# Patient Record
Sex: Female | Born: 1940 | Race: Black or African American | Hispanic: No | Marital: Married | Smoking: Never smoker
Health system: Southern US, Community
[De-identification: ages and names within clinical notes are randomized; demographics above are authoritative.]

## PROBLEM LIST (undated history)

## (undated) DIAGNOSIS — K8051 Calculus of bile duct without cholangitis or cholecystitis with obstruction: Secondary | ICD-10-CM

## (undated) DIAGNOSIS — M1990 Unspecified osteoarthritis, unspecified site: Secondary | ICD-10-CM

## (undated) DIAGNOSIS — H547 Unspecified visual loss: Secondary | ICD-10-CM

## (undated) DIAGNOSIS — K219 Gastro-esophageal reflux disease without esophagitis: Secondary | ICD-10-CM

## (undated) DIAGNOSIS — E785 Hyperlipidemia, unspecified: Secondary | ICD-10-CM

## (undated) DIAGNOSIS — K589 Irritable bowel syndrome without diarrhea: Secondary | ICD-10-CM

## (undated) DIAGNOSIS — B009 Herpesviral infection, unspecified: Secondary | ICD-10-CM

## (undated) DIAGNOSIS — I341 Nonrheumatic mitral (valve) prolapse: Secondary | ICD-10-CM

## (undated) DIAGNOSIS — C50919 Malignant neoplasm of unspecified site of unspecified female breast: Secondary | ICD-10-CM

## (undated) HISTORY — PX: TOTAL ABDOMINAL HYSTERECTOMY W/ BILATERAL SALPINGOOPHORECTOMY: SHX83

## (undated) HISTORY — DX: Nonrheumatic mitral (valve) prolapse: I34.1

## (undated) HISTORY — DX: Herpesviral infection, unspecified: B00.9

## (undated) HISTORY — PX: TONSILLECTOMY: SUR1361

## (undated) HISTORY — PX: VARICOSE VEIN SURGERY: SHX832

## (undated) HISTORY — DX: Gastro-esophageal reflux disease without esophagitis: K21.9

## (undated) HISTORY — DX: Unspecified osteoarthritis, unspecified site: M19.90

## (undated) HISTORY — DX: Irritable bowel syndrome without diarrhea: K58.9

## (undated) HISTORY — DX: Hyperlipidemia, unspecified: E78.5

## (undated) HISTORY — DX: Unspecified visual loss: H54.7

## (undated) HISTORY — DX: Malignant neoplasm of unspecified site of unspecified female breast: C50.919

---

## 1991-09-03 HISTORY — PX: EYE SURGERY: SHX253

## 1997-09-02 DIAGNOSIS — B009 Herpesviral infection, unspecified: Secondary | ICD-10-CM

## 1997-09-02 HISTORY — DX: Herpesviral infection, unspecified: B00.9

## 1997-12-22 ENCOUNTER — Other Ambulatory Visit: Admission: RE | Admit: 1997-12-22 | Discharge: 1997-12-22 | Payer: Self-pay | Admitting: *Deleted

## 1998-03-29 ENCOUNTER — Other Ambulatory Visit: Admission: RE | Admit: 1998-03-29 | Discharge: 1998-03-29 | Payer: Self-pay | Admitting: *Deleted

## 1998-08-04 ENCOUNTER — Ambulatory Visit (HOSPITAL_COMMUNITY): Admission: RE | Admit: 1998-08-04 | Discharge: 1998-08-04 | Payer: Self-pay | Admitting: *Deleted

## 1999-05-03 ENCOUNTER — Other Ambulatory Visit: Admission: RE | Admit: 1999-05-03 | Discharge: 1999-05-03 | Payer: Self-pay | Admitting: *Deleted

## 1999-08-06 ENCOUNTER — Ambulatory Visit (HOSPITAL_COMMUNITY): Admission: RE | Admit: 1999-08-06 | Discharge: 1999-08-06 | Payer: Self-pay | Admitting: *Deleted

## 2000-05-07 ENCOUNTER — Encounter (INDEPENDENT_AMBULATORY_CARE_PROVIDER_SITE_OTHER): Payer: Self-pay | Admitting: Specialist

## 2000-05-07 ENCOUNTER — Ambulatory Visit (HOSPITAL_COMMUNITY): Admission: EM | Admit: 2000-05-07 | Discharge: 2000-05-07 | Payer: Self-pay | Admitting: Emergency Medicine

## 2000-05-13 ENCOUNTER — Other Ambulatory Visit: Admission: RE | Admit: 2000-05-13 | Discharge: 2000-05-13 | Payer: Self-pay | Admitting: *Deleted

## 2000-05-13 ENCOUNTER — Ambulatory Visit (HOSPITAL_COMMUNITY): Admission: RE | Admit: 2000-05-13 | Discharge: 2000-05-13 | Payer: Self-pay | Admitting: *Deleted

## 2000-05-15 ENCOUNTER — Encounter: Admission: RE | Admit: 2000-05-15 | Discharge: 2000-05-15 | Payer: Self-pay | Admitting: *Deleted

## 2000-06-20 ENCOUNTER — Ambulatory Visit (HOSPITAL_COMMUNITY): Admission: RE | Admit: 2000-06-20 | Discharge: 2000-06-20 | Payer: Self-pay | Admitting: Gastroenterology

## 2000-11-01 ENCOUNTER — Emergency Department (HOSPITAL_COMMUNITY): Admission: EM | Admit: 2000-11-01 | Discharge: 2000-11-01 | Payer: Self-pay | Admitting: Emergency Medicine

## 2001-05-18 ENCOUNTER — Encounter: Admission: RE | Admit: 2001-05-18 | Discharge: 2001-05-18 | Payer: Self-pay | Admitting: Internal Medicine

## 2001-06-09 ENCOUNTER — Ambulatory Visit (HOSPITAL_COMMUNITY): Admission: RE | Admit: 2001-06-09 | Discharge: 2001-06-09 | Payer: Self-pay | Admitting: Internal Medicine

## 2002-05-19 ENCOUNTER — Encounter: Payer: Self-pay | Admitting: Internal Medicine

## 2002-05-19 ENCOUNTER — Encounter: Admission: RE | Admit: 2002-05-19 | Discharge: 2002-05-19 | Payer: Self-pay | Admitting: Internal Medicine

## 2002-06-15 ENCOUNTER — Other Ambulatory Visit: Admission: RE | Admit: 2002-06-15 | Discharge: 2002-06-15 | Payer: Self-pay | Admitting: Internal Medicine

## 2002-12-15 ENCOUNTER — Encounter: Payer: Self-pay | Admitting: Internal Medicine

## 2002-12-15 ENCOUNTER — Ambulatory Visit (HOSPITAL_COMMUNITY): Admission: RE | Admit: 2002-12-15 | Discharge: 2002-12-15 | Payer: Self-pay | Admitting: Internal Medicine

## 2003-05-24 ENCOUNTER — Encounter: Admission: RE | Admit: 2003-05-24 | Discharge: 2003-05-24 | Payer: Self-pay | Admitting: Internal Medicine

## 2003-05-24 ENCOUNTER — Encounter: Payer: Self-pay | Admitting: Internal Medicine

## 2003-06-21 ENCOUNTER — Other Ambulatory Visit: Admission: RE | Admit: 2003-06-21 | Discharge: 2003-06-21 | Payer: Self-pay | Admitting: Internal Medicine

## 2003-10-11 ENCOUNTER — Ambulatory Visit (HOSPITAL_COMMUNITY): Admission: RE | Admit: 2003-10-11 | Discharge: 2003-10-11 | Payer: Self-pay | Admitting: Gastroenterology

## 2004-05-24 ENCOUNTER — Ambulatory Visit (HOSPITAL_COMMUNITY): Admission: RE | Admit: 2004-05-24 | Discharge: 2004-05-24 | Payer: Self-pay | Admitting: Internal Medicine

## 2004-06-27 ENCOUNTER — Other Ambulatory Visit: Admission: RE | Admit: 2004-06-27 | Discharge: 2004-06-27 | Payer: Self-pay | Admitting: Hematology and Oncology

## 2004-07-11 ENCOUNTER — Ambulatory Visit: Payer: Self-pay | Admitting: Hematology and Oncology

## 2005-06-04 ENCOUNTER — Ambulatory Visit (HOSPITAL_COMMUNITY): Admission: RE | Admit: 2005-06-04 | Discharge: 2005-06-04 | Payer: Self-pay | Admitting: Internal Medicine

## 2006-06-09 ENCOUNTER — Ambulatory Visit (HOSPITAL_COMMUNITY): Admission: RE | Admit: 2006-06-09 | Discharge: 2006-06-09 | Payer: Self-pay | Admitting: Internal Medicine

## 2006-10-08 ENCOUNTER — Ambulatory Visit: Payer: Self-pay

## 2007-03-19 ENCOUNTER — Other Ambulatory Visit: Admission: RE | Admit: 2007-03-19 | Discharge: 2007-03-19 | Payer: Self-pay | Admitting: Radiology

## 2007-03-19 DIAGNOSIS — C50919 Malignant neoplasm of unspecified site of unspecified female breast: Secondary | ICD-10-CM | POA: Insufficient documentation

## 2007-03-19 HISTORY — DX: Malignant neoplasm of unspecified site of unspecified female breast: C50.919

## 2007-03-31 ENCOUNTER — Encounter: Admission: RE | Admit: 2007-03-31 | Discharge: 2007-03-31 | Payer: Self-pay | Admitting: Radiology

## 2007-04-02 ENCOUNTER — Encounter: Admission: RE | Admit: 2007-04-02 | Discharge: 2007-04-02 | Payer: Self-pay | Admitting: Surgery

## 2007-04-06 ENCOUNTER — Ambulatory Visit (HOSPITAL_BASED_OUTPATIENT_CLINIC_OR_DEPARTMENT_OTHER): Admission: RE | Admit: 2007-04-06 | Discharge: 2007-04-06 | Payer: Self-pay | Admitting: Surgery

## 2007-04-06 ENCOUNTER — Encounter (INDEPENDENT_AMBULATORY_CARE_PROVIDER_SITE_OTHER): Payer: Self-pay | Admitting: Surgery

## 2007-04-06 HISTORY — PX: BREAST LUMPECTOMY: SHX2

## 2007-04-21 ENCOUNTER — Ambulatory Visit: Admission: RE | Admit: 2007-04-21 | Discharge: 2007-05-26 | Payer: Self-pay | Admitting: Radiation Oncology

## 2007-04-23 ENCOUNTER — Ambulatory Visit: Payer: Self-pay | Admitting: Hematology and Oncology

## 2007-04-23 LAB — CBC WITH DIFFERENTIAL/PLATELET
Basophils Absolute: 0 10*3/uL (ref 0.0–0.1)
Eosinophils Absolute: 0.1 10*3/uL (ref 0.0–0.5)
HGB: 13.4 g/dL (ref 11.6–15.9)
MCV: 87.4 fL (ref 81.0–101.0)
MONO%: 12.9 % (ref 0.0–13.0)
NEUT#: 1.4 10*3/uL — ABNORMAL LOW (ref 1.5–6.5)
Platelets: 262 10*3/uL (ref 145–400)
RDW: 14.3 % (ref 11.3–14.5)

## 2007-04-23 LAB — COMPREHENSIVE METABOLIC PANEL
Albumin: 3.8 g/dL (ref 3.5–5.2)
Alkaline Phosphatase: 68 U/L (ref 39–117)
BUN: 10 mg/dL (ref 6–23)
Calcium: 9.1 mg/dL (ref 8.4–10.5)
Glucose, Bld: 85 mg/dL (ref 70–99)
Potassium: 4.4 mEq/L (ref 3.5–5.3)

## 2007-04-23 LAB — LACTATE DEHYDROGENASE: LDH: 145 U/L (ref 94–250)

## 2007-04-28 ENCOUNTER — Ambulatory Visit: Admission: RE | Admit: 2007-04-28 | Discharge: 2007-04-28 | Payer: Self-pay | Admitting: Hematology and Oncology

## 2007-04-30 ENCOUNTER — Ambulatory Visit (HOSPITAL_COMMUNITY): Admission: RE | Admit: 2007-04-30 | Discharge: 2007-04-30 | Payer: Self-pay | Admitting: Hematology and Oncology

## 2007-04-30 ENCOUNTER — Encounter: Payer: Self-pay | Admitting: Hematology and Oncology

## 2007-05-05 ENCOUNTER — Ambulatory Visit (HOSPITAL_COMMUNITY): Admission: RE | Admit: 2007-05-05 | Discharge: 2007-05-05 | Payer: Self-pay | Admitting: Hematology and Oncology

## 2007-05-06 ENCOUNTER — Ambulatory Visit (HOSPITAL_BASED_OUTPATIENT_CLINIC_OR_DEPARTMENT_OTHER): Admission: RE | Admit: 2007-05-06 | Discharge: 2007-05-07 | Payer: Self-pay | Admitting: Surgery

## 2007-05-06 ENCOUNTER — Encounter (INDEPENDENT_AMBULATORY_CARE_PROVIDER_SITE_OTHER): Payer: Self-pay | Admitting: Surgery

## 2007-05-06 HISTORY — PX: AXILLARY NODE DISSECTION: SHX1211

## 2007-05-14 LAB — CBC WITH DIFFERENTIAL/PLATELET
EOS%: 3.2 % (ref 0.0–7.0)
Eosinophils Absolute: 0.1 10*3/uL (ref 0.0–0.5)
LYMPH%: 40.2 % (ref 14.0–48.0)
MCH: 30.2 pg (ref 26.0–34.0)
MCV: 86.9 fL (ref 81.0–101.0)
MONO%: 10.3 % (ref 0.0–13.0)
NEUT#: 1.8 10*3/uL (ref 1.5–6.5)
Platelets: 244 10*3/uL (ref 145–400)
RBC: 4.21 10*6/uL (ref 3.70–5.32)

## 2007-05-14 LAB — COMPREHENSIVE METABOLIC PANEL
ALT: 9 U/L (ref 0–35)
CO2: 25 mEq/L (ref 19–32)
Creatinine, Ser: 0.66 mg/dL (ref 0.40–1.20)
Glucose, Bld: 97 mg/dL (ref 70–99)
Total Bilirubin: 0.4 mg/dL (ref 0.3–1.2)

## 2007-05-14 LAB — UA PROTEIN, DIPSTICK - CHCC: Protein, Urine: NEGATIVE mg/dL

## 2007-05-20 ENCOUNTER — Ambulatory Visit (HOSPITAL_COMMUNITY): Admission: RE | Admit: 2007-05-20 | Discharge: 2007-05-20 | Payer: Self-pay | Admitting: Hematology and Oncology

## 2007-05-25 ENCOUNTER — Encounter: Payer: Self-pay | Admitting: Hematology and Oncology

## 2007-05-25 ENCOUNTER — Other Ambulatory Visit: Admission: RE | Admit: 2007-05-25 | Discharge: 2007-05-25 | Payer: Self-pay | Admitting: Hematology and Oncology

## 2007-05-28 ENCOUNTER — Encounter: Admission: RE | Admit: 2007-05-28 | Discharge: 2007-08-26 | Payer: Self-pay | Admitting: Surgery

## 2007-06-03 LAB — FLOW CYTOMETRY

## 2007-06-09 ENCOUNTER — Ambulatory Visit: Payer: Self-pay | Admitting: Hematology and Oncology

## 2007-06-10 LAB — CBC WITH DIFFERENTIAL/PLATELET
BASO%: 1.3 % (ref 0.0–2.0)
Eosinophils Absolute: 0 10*3/uL (ref 0.0–0.5)
MCHC: 35 g/dL (ref 32.0–36.0)
MONO#: 0.2 10*3/uL (ref 0.1–0.9)
NEUT#: 0.5 10*3/uL — ABNORMAL LOW (ref 1.5–6.5)
RBC: 4.59 10*6/uL (ref 3.70–5.32)
RDW: 13.9 % (ref 11.3–14.5)
WBC: 1.5 10*3/uL — ABNORMAL LOW (ref 3.9–10.0)
lymph#: 0.8 10*3/uL — ABNORMAL LOW (ref 0.9–3.3)

## 2007-06-23 LAB — CBC WITH DIFFERENTIAL/PLATELET
BASO%: 1.2 % (ref 0.0–2.0)
Eosinophils Absolute: 0.1 10*3/uL (ref 0.0–0.5)
HCT: 36.4 % (ref 34.8–46.6)
LYMPH%: 40.1 % (ref 14.0–48.0)
MONO#: 0.5 10*3/uL (ref 0.1–0.9)
NEUT#: 1.4 10*3/uL — ABNORMAL LOW (ref 1.5–6.5)
NEUT%: 41.1 % (ref 39.6–76.8)
Platelets: 255 10*3/uL (ref 145–400)
WBC: 3.3 10*3/uL — ABNORMAL LOW (ref 3.9–10.0)
lymph#: 1.3 10*3/uL (ref 0.9–3.3)

## 2007-06-23 LAB — RESEARCH LABS

## 2007-06-23 LAB — COMPREHENSIVE METABOLIC PANEL
ALT: 29 U/L (ref 0–35)
CO2: 29 mEq/L (ref 19–32)
Calcium: 8.8 mg/dL (ref 8.4–10.5)
Chloride: 104 mEq/L (ref 96–112)
Glucose, Bld: 118 mg/dL — ABNORMAL HIGH (ref 70–99)
Sodium: 142 mEq/L (ref 135–145)
Total Bilirubin: 0.6 mg/dL (ref 0.3–1.2)
Total Protein: 5.9 g/dL — ABNORMAL LOW (ref 6.0–8.3)

## 2007-06-23 LAB — UA PROTEIN, DIPSTICK - CHCC: Protein, Urine: <30 mg/dL

## 2007-07-15 LAB — CBC WITH DIFFERENTIAL/PLATELET
BASO%: 0.8 % (ref 0.0–2.0)
Basophils Absolute: 0 10*3/uL (ref 0.0–0.1)
EOS%: 1.5 % (ref 0.0–7.0)
HGB: 12.1 g/dL (ref 11.6–15.9)
MCH: 30 pg (ref 26.0–34.0)
MCHC: 34.4 g/dL (ref 32.0–36.0)
MCV: 87.3 fL (ref 81.0–101.0)
MONO%: 8.4 % (ref 0.0–13.0)
RBC: 4.05 10*6/uL (ref 3.70–5.32)
RDW: 16.2 % — ABNORMAL HIGH (ref 11.3–14.5)
lymph#: 1.2 10*3/uL (ref 0.9–3.3)

## 2007-07-15 LAB — COMPREHENSIVE METABOLIC PANEL
ALT: 31 U/L (ref 0–35)
Albumin: 3.2 g/dL — ABNORMAL LOW (ref 3.5–5.2)
Alkaline Phosphatase: 70 U/L (ref 39–117)
CO2: 28 mEq/L (ref 19–32)
Glucose, Bld: 130 mg/dL — ABNORMAL HIGH (ref 70–99)
Potassium: 4.2 mEq/L (ref 3.5–5.3)
Sodium: 142 mEq/L (ref 135–145)
Total Bilirubin: 0.5 mg/dL (ref 0.3–1.2)
Total Protein: 6.1 g/dL (ref 6.0–8.3)

## 2007-07-15 LAB — UA PROTEIN, DIPSTICK - CHCC: Protein, Urine: NEGATIVE mg/dL

## 2007-08-03 ENCOUNTER — Ambulatory Visit: Payer: Self-pay | Admitting: Hematology and Oncology

## 2007-08-03 ENCOUNTER — Ambulatory Visit: Payer: Self-pay

## 2007-08-03 ENCOUNTER — Encounter: Payer: Self-pay | Admitting: Hematology and Oncology

## 2007-08-05 LAB — CBC WITH DIFFERENTIAL/PLATELET
BASO%: 0.4 % (ref 0.0–2.0)
EOS%: 0.4 % (ref 0.0–7.0)
Eosinophils Absolute: 0 10*3/uL (ref 0.0–0.5)
LYMPH%: 32.9 % (ref 14.0–48.0)
MCHC: 35.1 g/dL (ref 32.0–36.0)
MCV: 88.7 fL (ref 81.0–101.0)
MONO%: 8.5 % (ref 0.0–13.0)
NEUT#: 2.3 10*3/uL (ref 1.5–6.5)
Platelets: 162 10*3/uL (ref 145–400)
RBC: 3.89 10*6/uL (ref 3.70–5.32)
RDW: 19 % — ABNORMAL HIGH (ref 11.3–14.5)

## 2007-08-05 LAB — COMPREHENSIVE METABOLIC PANEL
ALT: 29 U/L (ref 0–35)
AST: 28 U/L (ref 0–37)
Albumin: 3.2 g/dL — ABNORMAL LOW (ref 3.5–5.2)
Alkaline Phosphatase: 76 U/L (ref 39–117)
BUN: 5 mg/dL — ABNORMAL LOW (ref 6–23)
CO2: 29 mEq/L (ref 19–32)
Calcium: 9.5 mg/dL (ref 8.4–10.5)
Chloride: 107 mEq/L (ref 96–112)
Creatinine, Ser: 0.74 mg/dL (ref 0.40–1.20)
Glucose, Bld: 112 mg/dL — ABNORMAL HIGH (ref 70–99)
Potassium: 3.6 mEq/L (ref 3.5–5.3)
Sodium: 142 mEq/L (ref 135–145)
Total Bilirubin: 0.4 mg/dL (ref 0.3–1.2)
Total Protein: 6.3 g/dL (ref 6.0–8.3)

## 2007-08-05 LAB — UA PROTEIN, DIPSTICK - CHCC: Protein, Urine: NEGATIVE mg/dL

## 2007-08-05 LAB — RESEARCH LABS

## 2007-08-25 LAB — COMPREHENSIVE METABOLIC PANEL
ALT: 31 U/L (ref 0–35)
AST: 31 U/L (ref 0–37)
Albumin: 3.3 g/dL — ABNORMAL LOW (ref 3.5–5.2)
Calcium: 8.8 mg/dL (ref 8.4–10.5)
Chloride: 105 mEq/L (ref 96–112)
Potassium: 3.1 mEq/L — ABNORMAL LOW (ref 3.5–5.3)
Sodium: 142 mEq/L (ref 135–145)
Total Protein: 6.1 g/dL (ref 6.0–8.3)

## 2007-08-25 LAB — CBC WITH DIFFERENTIAL/PLATELET
BASO%: 1 % (ref 0.0–2.0)
MCHC: 34.9 g/dL (ref 32.0–36.0)
MONO#: 0.4 10*3/uL (ref 0.1–0.9)
RBC: 3.58 10*6/uL — ABNORMAL LOW (ref 3.70–5.32)
WBC: 3.7 10*3/uL — ABNORMAL LOW (ref 3.9–10.0)
lymph#: 1.5 10*3/uL (ref 0.9–3.3)

## 2007-08-25 LAB — UA PROTEIN, DIPSTICK - CHCC: Protein, Urine: NEGATIVE mg/dL

## 2007-09-16 LAB — COMPREHENSIVE METABOLIC PANEL
AST: 31 U/L (ref 0–37)
Alkaline Phosphatase: 62 U/L (ref 39–117)
BUN: 5 mg/dL — ABNORMAL LOW (ref 6–23)
Creatinine, Ser: 0.72 mg/dL (ref 0.40–1.20)
Glucose, Bld: 106 mg/dL — ABNORMAL HIGH (ref 70–99)
Potassium: 3.4 mEq/L — ABNORMAL LOW (ref 3.5–5.3)
Total Bilirubin: 0.6 mg/dL (ref 0.3–1.2)

## 2007-09-16 LAB — CBC WITH DIFFERENTIAL/PLATELET
BASO%: 0 % (ref 0.0–2.0)
EOS%: 0.1 % (ref 0.0–7.0)
HCT: 31.5 % — ABNORMAL LOW (ref 34.8–46.6)
MCH: 32.5 pg (ref 26.0–34.0)
MCHC: 34.1 g/dL (ref 32.0–36.0)
NEUT%: 74.7 % (ref 39.6–76.8)
lymph#: 0.8 10*3/uL — ABNORMAL LOW (ref 0.9–3.3)

## 2007-09-18 ENCOUNTER — Ambulatory Visit: Payer: Self-pay | Admitting: Hematology and Oncology

## 2007-10-02 ENCOUNTER — Encounter: Payer: Self-pay | Admitting: Hematology and Oncology

## 2007-10-02 ENCOUNTER — Ambulatory Visit: Payer: Self-pay

## 2007-10-06 LAB — COMPREHENSIVE METABOLIC PANEL
AST: 30 U/L (ref 0–37)
Alkaline Phosphatase: 69 U/L (ref 39–117)
BUN: 3 mg/dL — ABNORMAL LOW (ref 6–23)
Calcium: 7.9 mg/dL — ABNORMAL LOW (ref 8.4–10.5)
Creatinine, Ser: 0.77 mg/dL (ref 0.40–1.20)
Total Bilirubin: 0.4 mg/dL (ref 0.3–1.2)

## 2007-10-06 LAB — CBC WITH DIFFERENTIAL/PLATELET
Eosinophils Absolute: 0 10*3/uL (ref 0.0–0.5)
MONO#: 0.4 10*3/uL (ref 0.1–0.9)
MONO%: 11.9 % (ref 0.0–13.0)
NEUT#: 1.4 10*3/uL — ABNORMAL LOW (ref 1.5–6.5)
RBC: 3.26 10*6/uL — ABNORMAL LOW (ref 3.70–5.32)
RDW: 19.7 % — ABNORMAL HIGH (ref 11.3–14.5)
WBC: 3.2 10*3/uL — ABNORMAL LOW (ref 3.9–10.0)
lymph#: 1.4 10*3/uL (ref 0.9–3.3)

## 2007-10-06 LAB — UA PROTEIN, DIPSTICK - CHCC: Protein, Urine: <30 mg/dL

## 2007-10-12 ENCOUNTER — Ambulatory Visit: Admission: RE | Admit: 2007-10-12 | Discharge: 2007-12-17 | Payer: Self-pay | Admitting: Radiation Oncology

## 2007-10-23 ENCOUNTER — Ambulatory Visit: Payer: Self-pay | Admitting: Hematology and Oncology

## 2007-10-28 ENCOUNTER — Encounter: Admission: RE | Admit: 2007-10-28 | Discharge: 2007-12-22 | Payer: Self-pay | Admitting: Radiation Oncology

## 2007-11-17 LAB — COMPREHENSIVE METABOLIC PANEL
Albumin: 4 g/dL (ref 3.5–5.2)
CO2: 27 mEq/L (ref 19–32)
Calcium: 9.2 mg/dL (ref 8.4–10.5)
Glucose, Bld: 100 mg/dL — ABNORMAL HIGH (ref 70–99)
Potassium: 3.7 mEq/L (ref 3.5–5.3)
Sodium: 141 mEq/L (ref 135–145)
Total Protein: 7.1 g/dL (ref 6.0–8.3)

## 2007-11-17 LAB — CBC WITH DIFFERENTIAL/PLATELET
Basophils Absolute: 0 10*3/uL (ref 0.0–0.1)
Eosinophils Absolute: 0.1 10*3/uL (ref 0.0–0.5)
HCT: 38.9 % (ref 34.8–46.6)
HGB: 13.5 g/dL (ref 11.6–15.9)
MCV: 97.2 fL (ref 81.0–101.0)
NEUT#: 1.5 10*3/uL (ref 1.5–6.5)
RDW: 13.6 % (ref 11.3–14.5)
lymph#: 1.4 10*3/uL (ref 0.9–3.3)

## 2007-12-08 ENCOUNTER — Ambulatory Visit: Payer: Self-pay | Admitting: Hematology and Oncology

## 2007-12-30 LAB — COMPREHENSIVE METABOLIC PANEL
ALT: 14 U/L (ref 0–35)
AST: 21 U/L (ref 0–37)
Albumin: 3.3 g/dL — ABNORMAL LOW (ref 3.5–5.2)
Alkaline Phosphatase: 52 U/L (ref 39–117)
BUN: 9 mg/dL (ref 6–23)
Potassium: 3.5 mEq/L (ref 3.5–5.3)

## 2007-12-30 LAB — CBC WITH DIFFERENTIAL/PLATELET
BASO%: 0.4 % (ref 0.0–2.0)
EOS%: 3.2 % (ref 0.0–7.0)
HCT: 40 % (ref 34.8–46.6)
MCH: 31.9 pg (ref 26.0–34.0)
MCHC: 34.4 g/dL (ref 32.0–36.0)
NEUT%: 50.5 % (ref 39.6–76.8)
RBC: 4.31 10*6/uL (ref 3.70–5.32)
RDW: 13.9 % (ref 11.3–14.5)
WBC: 2.8 10*3/uL — ABNORMAL LOW (ref 3.9–10.0)
lymph#: 1 10*3/uL (ref 0.9–3.3)

## 2007-12-30 LAB — UA PROTEIN, DIPSTICK - CHCC: Protein, Urine: <30 mg/dL

## 2008-01-11 ENCOUNTER — Encounter: Payer: Self-pay | Admitting: Hematology and Oncology

## 2008-01-11 ENCOUNTER — Ambulatory Visit: Payer: Self-pay

## 2008-01-19 ENCOUNTER — Ambulatory Visit: Payer: Self-pay | Admitting: Hematology and Oncology

## 2008-02-10 LAB — CBC WITH DIFFERENTIAL/PLATELET
BASO%: 0.3 % (ref 0.0–2.0)
Basophils Absolute: 0 10*3/uL (ref 0.0–0.1)
EOS%: 3.5 % (ref 0.0–7.0)
Eosinophils Absolute: 0.1 10*3/uL (ref 0.0–0.5)
HCT: 37.4 % (ref 34.8–46.6)
HGB: 12.8 g/dL (ref 11.6–15.9)
LYMPH%: 43.8 % (ref 14.0–48.0)
MCH: 30.4 pg (ref 26.0–34.0)
MCHC: 34 g/dL (ref 32.0–36.0)
MCV: 89.2 fL (ref 81.0–101.0)
MONO#: 0.4 10*3/uL (ref 0.1–0.9)
MONO%: 11.7 % (ref 0.0–13.0)
NEUT#: 1.2 10*3/uL — ABNORMAL LOW (ref 1.5–6.5)
NEUT%: 40.7 % (ref 39.6–76.8)
Platelets: 197 10*3/uL (ref 145–400)
RBC: 4.2 10*6/uL (ref 3.70–5.32)
RDW: 14.2 % (ref 11.3–14.5)
WBC: 3 10*3/uL — ABNORMAL LOW (ref 3.9–10.0)
lymph#: 1.3 10*3/uL (ref 0.9–3.3)

## 2008-02-10 LAB — COMPREHENSIVE METABOLIC PANEL
ALT: 10 U/L (ref 0–35)
AST: 15 U/L (ref 0–37)
Albumin: 3.7 g/dL (ref 3.5–5.2)
CO2: 26 mEq/L (ref 19–32)
Calcium: 8.8 mg/dL (ref 8.4–10.5)
Chloride: 105 mEq/L (ref 96–112)
Potassium: 4.1 mEq/L (ref 3.5–5.3)
Sodium: 141 mEq/L (ref 135–145)
Total Protein: 6.4 g/dL (ref 6.0–8.3)

## 2008-02-10 LAB — UA PROTEIN, DIPSTICK - CHCC: Protein, Urine: NEGATIVE mg/dL

## 2008-03-09 ENCOUNTER — Ambulatory Visit: Payer: Self-pay | Admitting: Hematology and Oncology

## 2008-03-09 ENCOUNTER — Ambulatory Visit (HOSPITAL_COMMUNITY): Admission: RE | Admit: 2008-03-09 | Discharge: 2008-03-09 | Payer: Self-pay | Admitting: Hematology and Oncology

## 2008-03-09 LAB — CBC WITH DIFFERENTIAL/PLATELET
Eosinophils Absolute: 0.1 10*3/uL (ref 0.0–0.5)
MONO#: 0.4 10*3/uL (ref 0.1–0.9)
MONO%: 10 % (ref 0.0–13.0)
NEUT#: 2.1 10*3/uL (ref 1.5–6.5)
RBC: 4.28 10*6/uL (ref 3.70–5.32)
RDW: 15.1 % — ABNORMAL HIGH (ref 11.3–14.5)
WBC: 4.2 10*3/uL (ref 3.9–10.0)

## 2008-03-23 LAB — COMPREHENSIVE METABOLIC PANEL
ALT: 12 U/L (ref 0–35)
Albumin: 4 g/dL (ref 3.5–5.2)
CO2: 27 mEq/L (ref 19–32)
Calcium: 9.4 mg/dL (ref 8.4–10.5)
Chloride: 104 mEq/L (ref 96–112)
Creatinine, Ser: 0.91 mg/dL (ref 0.40–1.20)
Potassium: 4 mEq/L (ref 3.5–5.3)
Sodium: 142 mEq/L (ref 135–145)
Total Protein: 6.7 g/dL (ref 6.0–8.3)

## 2008-03-23 LAB — CBC WITH DIFFERENTIAL/PLATELET
Eosinophils Absolute: 0.1 10*3/uL (ref 0.0–0.5)
HCT: 39 % (ref 34.8–46.6)
LYMPH%: 38.6 % (ref 14.0–48.0)
MONO#: 0.4 10*3/uL (ref 0.1–0.9)
NEUT#: 1.3 10*3/uL — ABNORMAL LOW (ref 1.5–6.5)
Platelets: 198 10*3/uL (ref 145–400)
RBC: 4.37 10*6/uL (ref 3.70–5.32)
WBC: 3 10*3/uL — ABNORMAL LOW (ref 3.9–10.0)

## 2008-03-23 LAB — UA PROTEIN, DIPSTICK - CHCC: Protein, Urine: NEGATIVE mg/dL

## 2008-04-29 ENCOUNTER — Ambulatory Visit: Payer: Self-pay | Admitting: Hematology and Oncology

## 2008-05-04 LAB — CBC WITH DIFFERENTIAL/PLATELET
BASO%: 0.4 % (ref 0.0–2.0)
Eosinophils Absolute: 0.1 10*3/uL (ref 0.0–0.5)
LYMPH%: 39.7 % (ref 14.0–48.0)
MCHC: 34.3 g/dL (ref 32.0–36.0)
MONO#: 0.3 10*3/uL (ref 0.1–0.9)
MONO%: 9.2 % (ref 0.0–13.0)
NEUT#: 1.4 10*3/uL — ABNORMAL LOW (ref 1.5–6.5)
Platelets: 184 10*3/uL (ref 145–400)
RBC: 4.51 10*6/uL (ref 3.70–5.32)
RDW: 14.7 % — ABNORMAL HIGH (ref 11.3–14.5)
WBC: 2.9 10*3/uL — ABNORMAL LOW (ref 3.9–10.0)

## 2008-05-04 LAB — COMPREHENSIVE METABOLIC PANEL
ALT: 12 U/L (ref 0–35)
AST: 14 U/L (ref 0–37)
Creatinine, Ser: 0.9 mg/dL (ref 0.40–1.20)
Sodium: 143 mEq/L (ref 135–145)
Total Bilirubin: 0.4 mg/dL (ref 0.3–1.2)
Total Protein: 6.6 g/dL (ref 6.0–8.3)

## 2008-05-04 LAB — UA PROTEIN, DIPSTICK - CHCC: Protein, Urine: <30 mg/dL

## 2008-06-14 ENCOUNTER — Ambulatory Visit: Payer: Self-pay | Admitting: Hematology and Oncology

## 2008-06-16 LAB — CBC WITH DIFFERENTIAL/PLATELET
Basophils Absolute: 0 10*3/uL (ref 0.0–0.1)
EOS%: 3.3 % (ref 0.0–7.0)
HCT: 41.3 % (ref 34.8–46.6)
HGB: 14.1 g/dL (ref 11.6–15.9)
MCH: 31 pg (ref 26.0–34.0)
MCV: 91.2 fL (ref 81.0–101.0)
MONO%: 9.1 % (ref 0.0–13.0)
NEUT%: 44.3 % (ref 39.6–76.8)
lymph#: 1.4 10*3/uL (ref 0.9–3.3)

## 2008-06-16 LAB — COMPREHENSIVE METABOLIC PANEL
AST: 14 U/L (ref 0–37)
Albumin: 3.9 g/dL (ref 3.5–5.2)
BUN: 13 mg/dL (ref 6–23)
CO2: 25 mEq/L (ref 19–32)
Calcium: 9.3 mg/dL (ref 8.4–10.5)
Chloride: 104 mEq/L (ref 96–112)
Creatinine, Ser: 0.84 mg/dL (ref 0.40–1.20)
Glucose, Bld: 96 mg/dL (ref 70–99)
Potassium: 3.9 mEq/L (ref 3.5–5.3)

## 2008-06-16 LAB — CANCER ANTIGEN 27.29: CA 27.29: 26 U/mL (ref 0–39)

## 2008-07-04 ENCOUNTER — Ambulatory Visit (HOSPITAL_BASED_OUTPATIENT_CLINIC_OR_DEPARTMENT_OTHER): Admission: RE | Admit: 2008-07-04 | Discharge: 2008-07-04 | Payer: Self-pay | Admitting: Surgery

## 2008-07-06 IMAGING — CT CT CHEST W/ CM
2 of 5 series · 16 of 46 positions shown, 18 images · IV contrast (agent unspecified)
Comparison: None

CT Chest WITH CONTRAST:

Addendum Begins
Addendum: 14 mm lesion in the medial aspect of the right breast described below
would be consistent with  post lumpectomy site.
Addendum Ends
Addendum: Heterogeneous cystic enlargement of the thyroid gland most consistent
with multinodular goiter.
Clinical data 66-year-old female with newly diagnosed breast cancer, staging

Exam: CT CHEST ABDOMEN AND PELVIS WITH CONTRAST:
Contiguous axial CT images were obtained from the thoracic inlet through the
pubic symphysis following administration of IV and oral contrast. 
100 cc omni-888 was administered intravenously.

[Series 2: cap 5.0 b40f · axial · 0.77mm/px · z∈[-610,-84]mm · 13 of 119 slices shown, 15 images]
[im 7/119  soft-tissue]
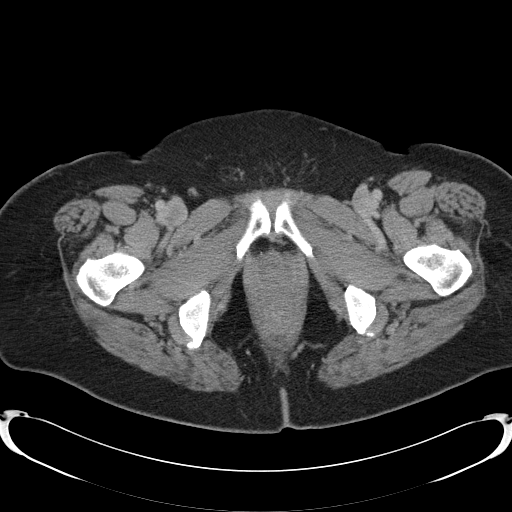
[im 7/119  bone]
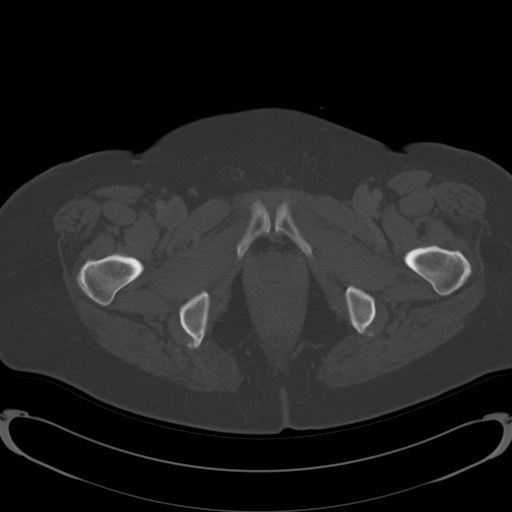
[im 14/119  soft-tissue]
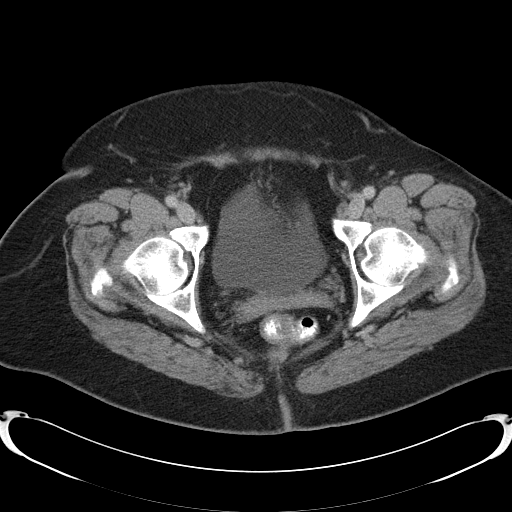
[im 28/119  soft-tissue]
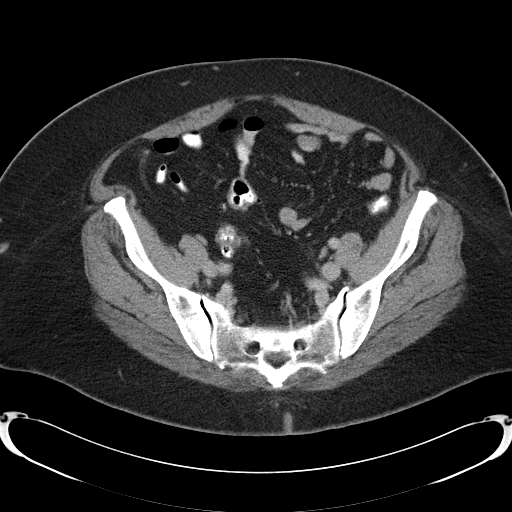
[im 35/119  soft-tissue]
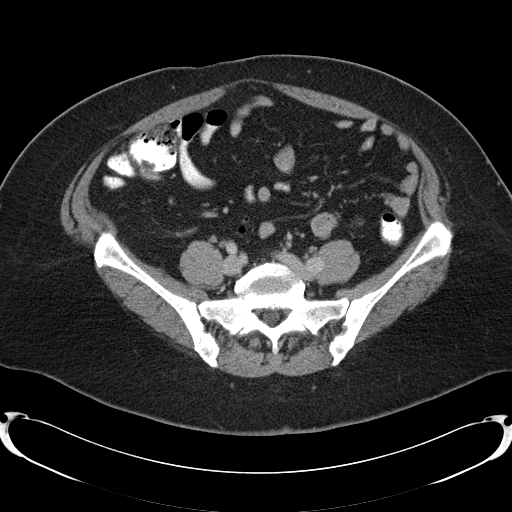
[im 42/119  soft-tissue]
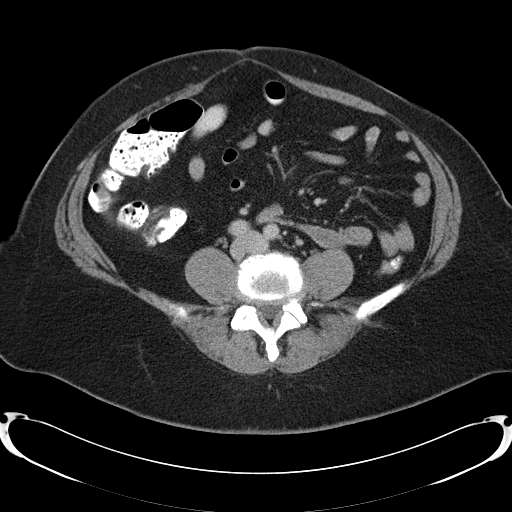
[im 49/119  soft-tissue]
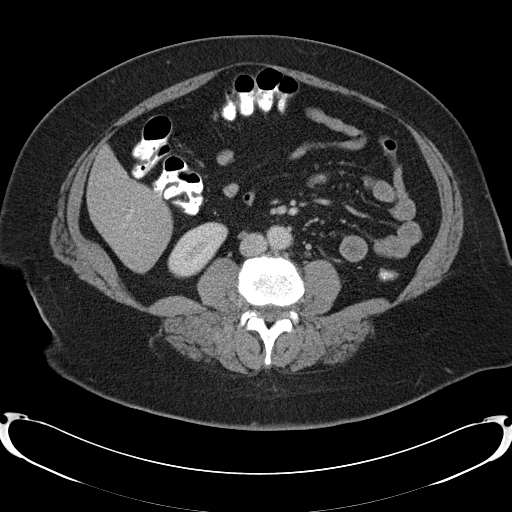
[im 63/119  soft-tissue]
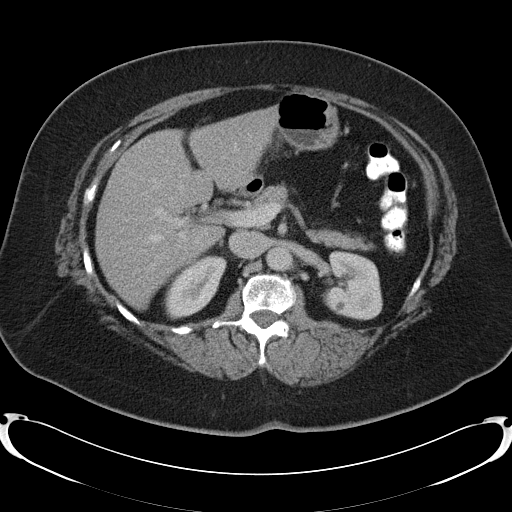
[im 70/119  soft-tissue]
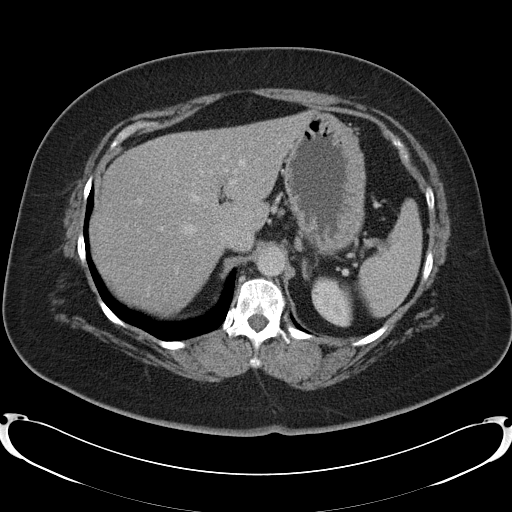
[im 77/119  soft-tissue]
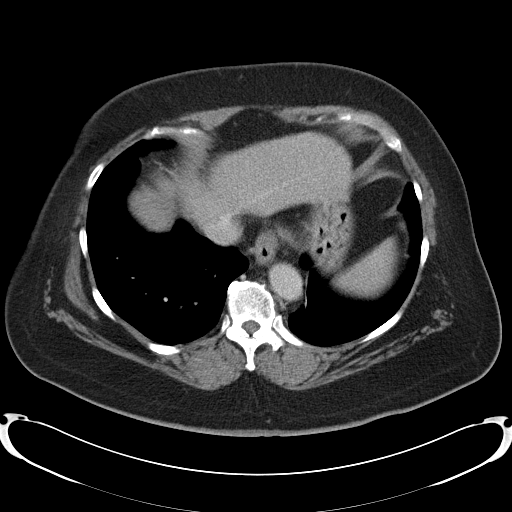
[im 77/119  bone]
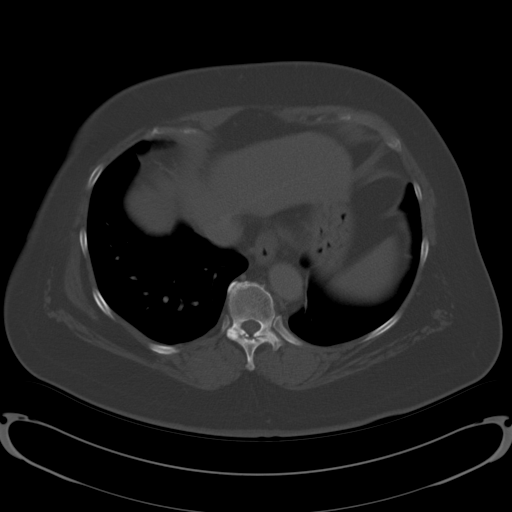
[im 84/119  soft-tissue]
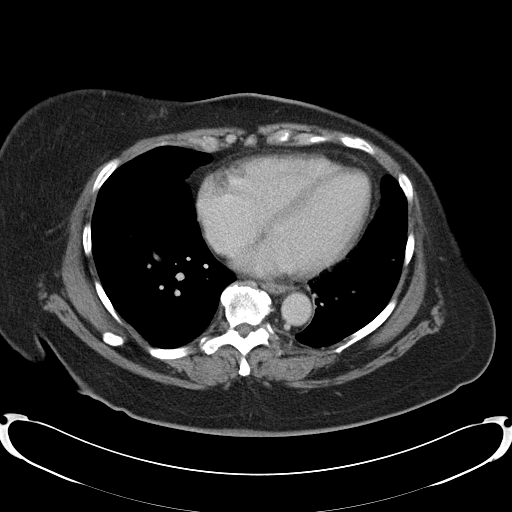
[im 91/119  soft-tissue]
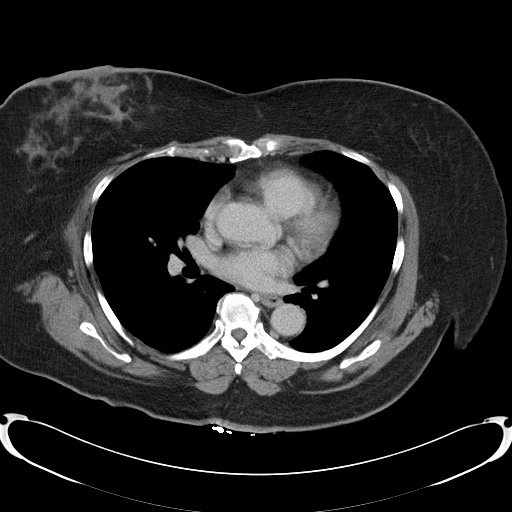
[im 105/119  soft-tissue]
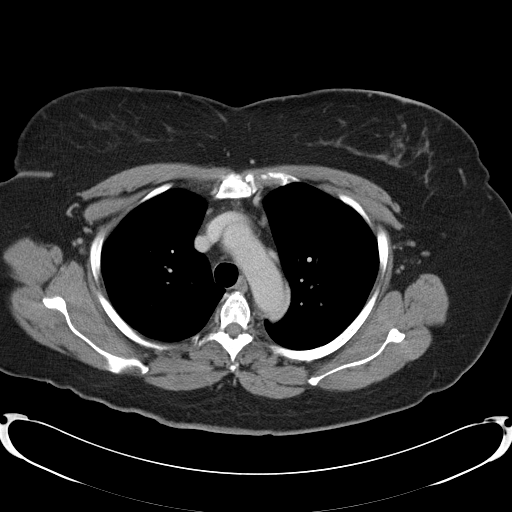
[im 112/119  soft-tissue]
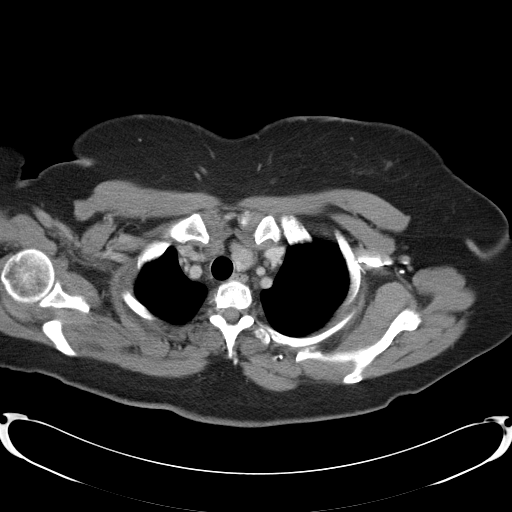

[Series 602: <mpr thick range> · coronal · 1.16mm/px · 3 of 82 slices shown]
[im 28/82  soft-tissue]
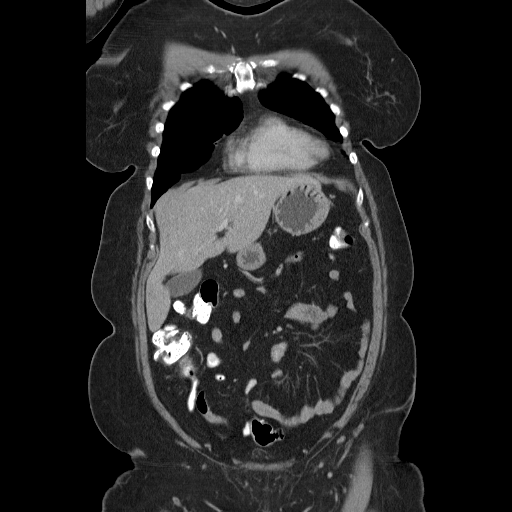
[im 37/82  soft-tissue]
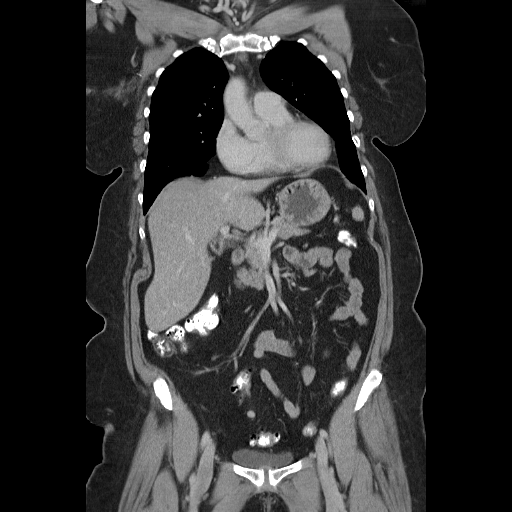
[im 46/82  soft-tissue]
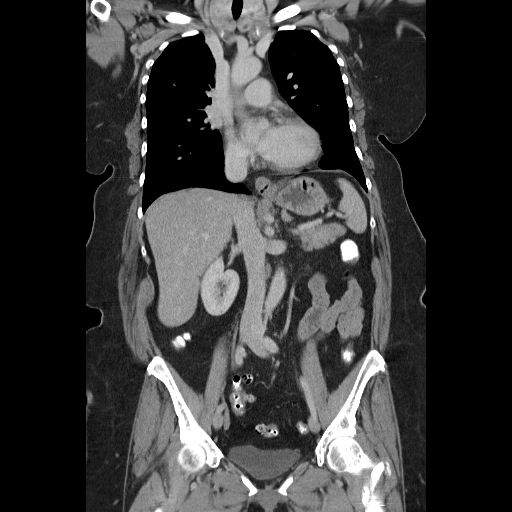

[16 of 46 positions shown; findings below may reference images not displayed]

FINDINGS: 14 mm mass in the medial aspect of the right breast with associated skin
thickening. Lymphadenectomy clips within the high right axilla. No
pathologically enlarged right axillary lymph nodes. The left breast appears
normal with no evidence of left axillary adenopathy. No evidence of mediastinal
or hilar lymphadenopathy. No suspicious pulmonary nodules.
IMPRESSION: 1. Small right breast mass with associated skin thickening consistent with right
breast cancer.
2. Prior right axillary dissection. 
3. No evidence of metastatic disease within the chest.

CT ABDOMEN WITH CONTRAST
FINDINGS: Ill-defined 5 mm hypodensity within the posterior aspect of the right hepatic
lobe (image 55) is too small to characterize. Linear hypodensities in the dome
of the liver represent diaphragmatic slips. Normal gallbladder, pancreas,
adrenal glands, and spleen. Within the left kidney, 7 mm hypodensity within the
midpole too small characterize. In the inferior pole right kidney there is a
triangular hyper density felt to represent a renal pyramid. (image 31). There
are small gastrohepatic ligament and periportal lymph nodes are no pathologic by
CT size criteria. The largest is a 9 mm  x 15 mm  celiac node on image 51.
IMPRESSION: 1. No convincing evidence metastatic disease within the abdomen.
2. Several gastrohepatic and periportal subcentimeter short axis nodes are not
pathologic by CT size criteria. The largest is a 9 mm celiac node.  Non specific
finding.
3. Subtle hepatic hypodensity (5 mm) to small characterize.
3. Hypodensity within the midpole left kidney too small characterize. 

CT PELVIS WITH CONTRAST:
FINDINGS: No evidence of pelvic lymphadenopathy. Scattered sigmoid diverticula. 
Normal bladder and rectum. Likely prior hysterectomy. Review of bone windows
demonstrates no aggressive osseous lesion.
IMPRESSION: 1. No evidence of metastatic disease in the pelvis.
2. Sigmoid diverticulosis without evidence of diverticulitis.

## 2008-08-02 ENCOUNTER — Ambulatory Visit (HOSPITAL_COMMUNITY): Admission: RE | Admit: 2008-08-02 | Discharge: 2008-08-02 | Payer: Self-pay | Admitting: Internal Medicine

## 2008-12-05 ENCOUNTER — Ambulatory Visit: Payer: Self-pay | Admitting: Hematology and Oncology

## 2008-12-07 ENCOUNTER — Ambulatory Visit: Payer: Self-pay

## 2008-12-07 ENCOUNTER — Encounter: Payer: Self-pay | Admitting: Hematology and Oncology

## 2008-12-07 LAB — CBC WITH DIFFERENTIAL/PLATELET
EOS%: 1.9 % (ref 0.0–7.0)
MCH: 31.3 pg (ref 25.1–34.0)
MCV: 91.8 fL (ref 79.5–101.0)
MONO%: 6.9 % (ref 0.0–14.0)
NEUT#: 1.6 10*3/uL (ref 1.5–6.5)
RBC: 4.07 10*6/uL (ref 3.70–5.45)
RDW: 14.5 % (ref 11.2–14.5)
lymph#: 1.5 10*3/uL (ref 0.9–3.3)

## 2008-12-07 LAB — COMPREHENSIVE METABOLIC PANEL
ALT: 9 U/L (ref 0–35)
AST: 13 U/L (ref 0–37)
Alkaline Phosphatase: 68 U/L (ref 39–117)
Sodium: 142 mEq/L (ref 135–145)
Total Bilirubin: 0.3 mg/dL (ref 0.3–1.2)
Total Protein: 6.2 g/dL (ref 6.0–8.3)

## 2008-12-07 LAB — RESEARCH LABS

## 2009-02-01 ENCOUNTER — Ambulatory Visit: Payer: Self-pay | Admitting: Hematology and Oncology

## 2009-02-23 ENCOUNTER — Encounter: Admission: RE | Admit: 2009-02-23 | Discharge: 2009-02-23 | Payer: Self-pay | Admitting: Internal Medicine

## 2009-04-04 ENCOUNTER — Ambulatory Visit: Payer: Self-pay | Admitting: Hematology and Oncology

## 2009-06-08 ENCOUNTER — Ambulatory Visit: Payer: Self-pay | Admitting: Hematology and Oncology

## 2009-06-12 LAB — CBC WITH DIFFERENTIAL/PLATELET
BASO%: 0.3 % (ref 0.0–2.0)
Basophils Absolute: 0 10*3/uL (ref 0.0–0.1)
EOS%: 3 % (ref 0.0–7.0)
HGB: 13.2 g/dL (ref 11.6–15.9)
MCH: 31.1 pg (ref 25.1–34.0)
MCHC: 34.1 g/dL (ref 31.5–36.0)
MCV: 91.3 fL (ref 79.5–101.0)
MONO%: 11.1 % (ref 0.0–14.0)
RBC: 4.24 10*6/uL (ref 3.70–5.45)
RDW: 14.1 % (ref 11.2–14.5)
lymph#: 1.1 10*3/uL (ref 0.9–3.3)

## 2009-06-12 LAB — COMPREHENSIVE METABOLIC PANEL
ALT: 12 U/L (ref 0–35)
CO2: 27 mEq/L (ref 19–32)
Calcium: 9.5 mg/dL (ref 8.4–10.5)
Chloride: 105 mEq/L (ref 96–112)
Creatinine, Ser: 0.78 mg/dL (ref 0.40–1.20)
Glucose, Bld: 101 mg/dL — ABNORMAL HIGH (ref 70–99)
Total Protein: 6.4 g/dL (ref 6.0–8.3)

## 2009-06-12 LAB — LACTATE DEHYDROGENASE: LDH: 132 U/L (ref 94–250)

## 2009-06-12 LAB — RESEARCH LABS

## 2009-06-12 LAB — CANCER ANTIGEN 27.29: CA 27.29: 23 U/mL (ref 0–39)

## 2009-09-02 HISTORY — PX: KNEE SURGERY: SHX244

## 2009-12-11 ENCOUNTER — Ambulatory Visit: Payer: Self-pay | Admitting: Hematology and Oncology

## 2009-12-13 LAB — CBC WITH DIFFERENTIAL/PLATELET
BASO%: 0.3 % (ref 0.0–2.0)
MCHC: 33.8 g/dL (ref 31.5–36.0)
MONO#: 0.3 10*3/uL (ref 0.1–0.9)
RBC: 4.51 10*6/uL (ref 3.70–5.45)
RDW: 14.1 % (ref 11.2–14.5)
WBC: 3.7 10*3/uL — ABNORMAL LOW (ref 3.9–10.3)
lymph#: 1.8 10*3/uL (ref 0.9–3.3)
nRBC: 0 % (ref 0–0)

## 2009-12-13 LAB — COMPREHENSIVE METABOLIC PANEL
ALT: 13 U/L (ref 0–35)
AST: 15 U/L (ref 0–37)
Albumin: 4 g/dL (ref 3.5–5.2)
Calcium: 9.4 mg/dL (ref 8.4–10.5)
Chloride: 103 mEq/L (ref 96–112)
Creatinine, Ser: 0.75 mg/dL (ref 0.40–1.20)
Potassium: 3.9 mEq/L (ref 3.5–5.3)

## 2010-06-04 ENCOUNTER — Ambulatory Visit: Payer: Self-pay | Admitting: Hematology and Oncology

## 2010-06-06 ENCOUNTER — Ambulatory Visit: Payer: Self-pay | Admitting: Cardiovascular Disease

## 2010-06-06 ENCOUNTER — Ambulatory Visit (HOSPITAL_COMMUNITY): Admission: RE | Admit: 2010-06-06 | Discharge: 2010-06-06 | Payer: Self-pay | Admitting: Hematology and Oncology

## 2010-06-06 ENCOUNTER — Encounter: Payer: Self-pay | Admitting: Hematology and Oncology

## 2010-06-06 ENCOUNTER — Ambulatory Visit: Payer: Self-pay

## 2010-06-06 LAB — CBC WITH DIFFERENTIAL/PLATELET
BASO%: 0.5 % (ref 0.0–2.0)
Basophils Absolute: 0 10*3/uL (ref 0.0–0.1)
EOS%: 3.3 % (ref 0.0–7.0)
Eosinophils Absolute: 0.1 10*3/uL (ref 0.0–0.5)
HCT: 39.4 % (ref 34.8–46.6)
HGB: 13.6 g/dL (ref 11.6–15.9)
LYMPH%: 43.5 % (ref 14.0–49.7)
MCH: 31.5 pg (ref 25.1–34.0)
MCHC: 34.4 g/dL (ref 31.5–36.0)
MCV: 91.5 fL (ref 79.5–101.0)
MONO#: 0.3 10*3/uL (ref 0.1–0.9)
MONO%: 10.1 % (ref 0.0–14.0)
NEUT#: 1.3 10*3/uL — ABNORMAL LOW (ref 1.5–6.5)
NEUT%: 42.6 % (ref 38.4–76.8)
Platelets: 217 10*3/uL (ref 145–400)
RBC: 4.31 10*6/uL (ref 3.70–5.45)
RDW: 14.6 % — ABNORMAL HIGH (ref 11.2–14.5)
WBC: 3.1 10*3/uL — ABNORMAL LOW (ref 3.9–10.3)
lymph#: 1.4 10*3/uL (ref 0.9–3.3)

## 2010-06-06 LAB — COMPREHENSIVE METABOLIC PANEL
ALT: 12 U/L (ref 0–35)
AST: 14 U/L (ref 0–37)
Albumin: 3.8 g/dL (ref 3.5–5.2)
Alkaline Phosphatase: 78 U/L (ref 39–117)
BUN: 13 mg/dL (ref 6–23)
CO2: 30 mEq/L (ref 19–32)
Calcium: 9.7 mg/dL (ref 8.4–10.5)
Chloride: 103 mEq/L (ref 96–112)
Creatinine, Ser: 0.83 mg/dL (ref 0.40–1.20)
Glucose, Bld: 89 mg/dL (ref 70–99)
Potassium: 4.1 mEq/L (ref 3.5–5.3)
Sodium: 141 mEq/L (ref 135–145)
Total Bilirubin: 0.3 mg/dL (ref 0.3–1.2)
Total Protein: 6.1 g/dL (ref 6.0–8.3)

## 2010-06-06 LAB — RESEARCH LABS

## 2010-06-06 LAB — LACTATE DEHYDROGENASE: LDH: 133 U/L (ref 94–250)

## 2010-06-06 LAB — CANCER ANTIGEN 27.29: CA 27.29: 20 U/mL (ref 0–39)

## 2010-07-04 ENCOUNTER — Ambulatory Visit: Payer: Self-pay | Admitting: Hematology and Oncology

## 2010-09-13 ENCOUNTER — Ambulatory Visit
Admission: RE | Admit: 2010-09-13 | Discharge: 2010-09-13 | Payer: Self-pay | Source: Home / Self Care | Attending: Orthopedic Surgery | Admitting: Orthopedic Surgery

## 2010-09-17 LAB — POCT HEMOGLOBIN-HEMACUE: Hemoglobin: 11.6 g/dL — ABNORMAL LOW (ref 12.0–15.0)

## 2010-09-22 ENCOUNTER — Encounter: Payer: Self-pay | Admitting: Internal Medicine

## 2010-09-26 NOTE — Op Note (Addendum)
  NAME:  Angela [name], Angela [name] NO.:  [id number]  MEDICAL RECORD NO.:  [id number]          PATIENT TYPE:  AMB  LOCATION:  DSC                          FACILITY:  [facility]  PHYSICIAN:  Loreta Ave, M.D. DATE OF BIRTH:  08-01-1941  DATE OF PROCEDURE:  09/13/2010 DATE OF DISCHARGE:                              OPERATIVE REPORT   PREOPERATIVE DIAGNOSES:  Right knee degenerative arthritis, tricompartmental with a large medial meniscus tear.  Loose bodies.  POSTOPERATIVE DIAGNOSIS:  Right knee degenerative arthritis, tricompartmental with a large medial meniscus tear, loose bodies with extensive tearing posterior medial meniscus, lesser extent tearing posterior half lateral meniscus.  Numerous small chondral, one large osteochondral loose body.  Grade 2 and 3 degenerative changes in all compartments.  PROCEDURE:  Right knee exam under anesthesia arthroscopy, removal of numerous small of one large loose body.  Tricompartmental debridement with chondroplasty and synovectomy.  Extensive partial medial and lateral meniscectomies.  SURGEON:  Loreta Ave, MD  ASSISTANT:  Zonia Kief, PA  ANESTHESIA:  General.  BLOOD LOSS:  Minimal.  SPECIMEN:  None.  CULTURES:  None.  COMPLICATIONS:  None.  DRESSING:  Soft compressive.  PROCEDURE:  The patient was brought to the operating room, placed on the operating table in supine position.  After adequate anesthesia had been obtained, knee examined.  Good motion and stability.  Leg holder was applied.  Leg prepped and draped in usual sterile fashion.  Two portals, one each medial and lateral parapatellar.  Arthroscope was introduced. Knee distended and inspected.  A lot of hypertrophic synovitis in all compartments debrided.  Numerous small chondral loose bodies removed. Chondroplasty of all compartments where there were grade 2 and 3 changes, peak of patella, medial trochlea.  The entire weightbearing dome medial  femoral condyle and portions of the weightbearing dome lateral femoral condyle.  Cruciate ligaments intact.  Extensive tearing entire posterior medial meniscus taken all the way out over the back half tapered into remaining meniscus, salvaging the anterior half. Lateral meniscus had radial complex tearing and this all stressed out retaining about half the meniscus all way around.  One relatively large 8-9 mm osteochondral loose body was then found in the popliteal tendon sheath.  I was able to tease to remove it.  I then looked down the sheath from above and below.  No other loose bodies there or any other recesses.  Entire knee examined.  No other findings appreciated. Instruments and fluid removed.  Portals of knee injected with Marcaine. Portals closed with 4-0 nylon.  Sterile compressive dressing applied. Anesthesia reversed.  Brought to recovery room.  Tolerated surgery well. No complications.     Loreta Ave, M.D.     DFM/MEDQ  D:  09/13/2010  T:  09/14/2010  Job:  [id number]  Electronically Signed by Mckinley Jewel M.D. on 09/26/2010 04:20:45 PM

## 2010-11-13 ENCOUNTER — Ambulatory Visit
Admission: RE | Admit: 2010-11-13 | Discharge: 2010-11-13 | Disposition: A | Discharge status: Home / Self Care | Payer: Medicare Other | Source: Ambulatory Visit | Attending: Internal Medicine | Admitting: Internal Medicine

## 2010-11-13 ENCOUNTER — Other Ambulatory Visit: Payer: Self-pay | Admitting: Internal Medicine

## 2010-11-13 DIAGNOSIS — M25511 Pain in right shoulder: Secondary | ICD-10-CM

## 2010-12-11 ENCOUNTER — Other Ambulatory Visit: Payer: Self-pay | Admitting: Hematology and Oncology

## 2010-12-11 ENCOUNTER — Encounter (HOSPITAL_BASED_OUTPATIENT_CLINIC_OR_DEPARTMENT_OTHER): Payer: Medicare Other | Admitting: Hematology and Oncology

## 2010-12-11 DIAGNOSIS — Z170 Estrogen receptor positive status [ER+]: Secondary | ICD-10-CM

## 2010-12-11 DIAGNOSIS — C50319 Malignant neoplasm of lower-inner quadrant of unspecified female breast: Secondary | ICD-10-CM

## 2010-12-11 LAB — CBC WITH DIFFERENTIAL/PLATELET
BASO%: 0.3 % (ref 0.0–2.0)
Basophils Absolute: 0 10*3/uL (ref 0.0–0.1)
HCT: 40 % (ref 34.8–46.6)
LYMPH%: 48.5 % (ref 14.0–49.7)
MCHC: 34.1 g/dL (ref 31.5–36.0)
MONO#: 0.3 10*3/uL (ref 0.1–0.9)
NEUT%: 39.7 % (ref 38.4–76.8)
Platelets: 210 10*3/uL (ref 145–400)
WBC: 4.2 10*3/uL (ref 3.9–10.3)

## 2010-12-11 LAB — COMPREHENSIVE METABOLIC PANEL
AST: 15 U/L (ref 0–37)
Albumin: 3.8 g/dL (ref 3.5–5.2)
Alkaline Phosphatase: 77 U/L (ref 39–117)
Potassium: 3.7 mEq/L (ref 3.5–5.3)
Sodium: 140 mEq/L (ref 135–145)
Total Protein: 5.9 g/dL — ABNORMAL LOW (ref 6.0–8.3)

## 2010-12-14 ENCOUNTER — Encounter (HOSPITAL_BASED_OUTPATIENT_CLINIC_OR_DEPARTMENT_OTHER): Payer: Medicare Other | Admitting: Hematology and Oncology

## 2010-12-14 DIAGNOSIS — Z170 Estrogen receptor positive status [ER+]: Secondary | ICD-10-CM

## 2010-12-14 DIAGNOSIS — C50319 Malignant neoplasm of lower-inner quadrant of unspecified female breast: Secondary | ICD-10-CM

## 2011-01-07 ENCOUNTER — Encounter (INDEPENDENT_AMBULATORY_CARE_PROVIDER_SITE_OTHER): Payer: Self-pay | Admitting: Surgery

## 2011-01-15 NOTE — Op Note (Signed)
NAME:  MAYE, PARKINSON NO.:  [id number]   MEDICAL RECORD NO.:  [id number]          PATIENT TYPE:  OUT   LOCATION:  XRAY                         FACILITY:  [facility]   PHYSICIAN:  Currie Paris, M.D.DATE OF BIRTH:  20-Oct-1940   DATE OF PROCEDURE:  05/06/2007  DATE OF DISCHARGE:                               OPERATIVE REPORT   PREOPERATIVE DIAGNOSIS:  Carcinoma, right breast.   POSTOPERATIVE DIAGNOSIS:  Carcinoma, right breast.   OPERATION:  Right axillary dissection; Port-A-Cath placement.   SURGEON:  Currie Paris, M.D.   ASSISTANT:  Dr. Freida Busman (axillary dissection portion only).   ANESTHESIA:  General.   CLINICAL HISTORY:  Ms. Laidlaw is a 70 year old lady who recently  presented with a small lower inner quadrant right breast mass.  She  underwent lumpectomy and sentinel node evaluation.  Preliminary  evaluation of the sentinel nodes indicated they were negative but they  turned out to be positive on final pathological review.  After radiation  and medical oncology consultation, it was elected to proceed to a right  axillary dissection and Port-A-Cath placement for chemo.   DESCRIPTION OF PROCEDURE:  The patient was seen in the holding area and  she had no further questions.  We confirmed the surgery plans as noted  above.  I marked the right side as the operative side.  The patient was  taken to the operating room and after satisfactory general anesthesia  had been obtained, the right axillary area was prepped and draped as a  sterile field.  The old scar was reopened and extended anteriorly and  posteriorly.  I divided the subcutaneous tissues down to the chest wall  from the pectoralis anteriorly to the latissimus posteriorly.   I then opened the clavipectoral fascia.  There was a lot of fat necrosis  from the prior surgery here and this tissue was fairly woody, but I was  able to come along there.  Vessels were clipped or cauterized,  depending  on their size.  As I got further up, I was then able to pull some of  this tissue away.  The second intercostal nerve was taken using some  clips.  I identified the axillary vein and tried to stay below that.  Once that was identified, I was then able to sweep contents from medial  to lateral and superior to inferior.  The long thoracic nerve was  identified and preserved.  The thoracodorsal nerve and vessels were  identified and preserved, keeping the nerve supply to the latissimus.  The lateral attachments and axillary attachments were divided and  finally, the lateral attachments to the latissimus were divided,  completing the axillary dissection.  Several nodes were noted, none of  which looked particularly suspicious and there was a fair amount, as  noted, of hard tissue apparently from the prior surgery a few weeks ago.   I irrigated and made sure everything was dry.  I checked the nerves and  made sure that they were functioning and they appeared to be working  okay.  I placed a 19 Blake drain in and secured it  with a 2-0 nylon.  We  irrigated again.  Again, everything had remained dry while I was placing  the drain and we went ahead and closed using 3-0 Vicryl and 4-0 Monocryl  subcuticular.   The patient was then repositioned and we reprepped and draped and used  new gowns and instruments.  The patient was placed in Trendelenburg  position.  The left subclavian vein was entered on the initial attempt  and the guidewire threaded into the superior vena cava right atrial  area.  The port site was picked fairly medially since the patient has  fairly large breast tissue and a far amount of fatty tissue and I  thought we could anchor it better there.  I removed some of the fatty  tissue so that I had a nice pocket that would be fairly thin.  I then  made a tunnel from there to the guidewire site and pulled the tubing  through.  The guidewire tract was dilated once and the  dilator and  guidewire removed and the catheter threaded into approximately 25 cm  where it aspirated and irrigated easily.   Using fluoro, I backed this up until I was where I thought I was at the  junction of the superior vena cava right atrium.  Again, this aspirated  and irrigated easily.  The reservoir was flushed, attached and the  locking mechanism engaged.  This aspirated and irrigated easily.  It was  then secured to the fascia with 2-0 Prolene.  At this point, we stopped  and did a final fluoro and we appeared to have no kinking, good  positioning, and good position of the tip.  The catheter was then  flushed with concentrated aqueous heparin.  The incision was closed with  3-0 Vicryl, 4-0 Monocryl subcuticular, and Dermabond.   The patient tolerated the procedure well and there were no operative  complications.  All counts were correct.      Currie Paris, M.D.  Electronically Signed     CJS/MEDQ  D:  05/06/2007  T:  05/06/2007  Job:  [id number]   cc:   [place] Daphine Deutscher, MD  Lauretta I. Odogwu, M.D.  Della Goo, M.D.

## 2011-01-15 NOTE — Op Note (Signed)
NAME:  Angela [name], Angela [name] NO.:  [id number]   MEDICAL RECORD NO.:  [id number]          PATIENT TYPE:  AMB   LOCATION:  DSC                          FACILITY:  [facility]   PHYSICIAN:  Currie Paris, M.D.DATE OF BIRTH:  1941-08-25   DATE OF PROCEDURE:  04/06/2007  DATE OF DISCHARGE:                               OPERATIVE REPORT   OFFICE MEDICAL RECORD NUMBER [id number]   PREOPERATIVE DIAGNOSIS:  Carcinoma, right breast lower inner quadrant.   POSTOPERATIVE DIAGNOSIS:  Carcinoma, right breast lower inner quadrant.   OPERATION:  Right lumpectomy with blue dye injection and axillary  sentinel lymph node biopsy.   SURGEON:  Currie Paris, M.D.   ANESTHESIA:  General.   CLINICAL HISTORY:  This is a 66-year lady who recently presented with a  small right breast mass.  It was initially thought to be perhaps an  epidermoid cyst but aspiration revealed some malignant cells.  After  discussion with the patient about alternatives, we elected to proceed to  a lumpectomy with sentinel node evaluation.  MRI showed only the single  lesion.   DESCRIPTION OF PROCEDURE:  The patient was seen in the holding area, and  she had no further questions.  We identified and marked the right breast  as the operative side.  She was subsequently injected with her  radioactive isotope for the sentinel lymph node.  She was then taken to  the operating room.   After satisfactory general anesthesia had been obtained, the time-out  occurred.  I then injected 5 mL of dilute methylene blue subareolarly on  the right and massaged that in.  The entire breast was then prepped and  draped.  I started with the axillary incision.  Using the Neoprobe I  identified a hot area and made a transverse incision and divided the  subcutaneous tissues.  She has predominantly fatty tissue in the axilla  and initially it was difficult to identify anything that was lymphatic,  but eventually I found a hot area  and then a small blue lymphatic  leading to a small blue lymph node, which was excised.  This had counts  of approximately 350.   Using the Neoprobe I found after much work a second hot area and after a  lengthy dissection found a second small node, this one only a few  millimeters in size but had counts of about 100.  This was likewise  removed.  With these two out, there was no palpable adenopathy, no blue  dye noted, and no other hot areas with counts now in the background of 5-  10.  I using cautery and clips for hemostasis in doing this portion of  the procedure and I temporarily placed a pack in the incision.   Attention was turned to the breast.  The mass was almost cutaneous and  located in the lower inner quadrant just medial to the areolar edge.  I  outlined and made an elliptical incision.  I excised the cancer with  what felt to be a nice margin of fatty tissue around it, but this fatty  tissue was very soft and just with gentle tugging pulled it away from  the tumor.  Once I had this out with what appeared to be at least 1-2 cm  deep margin, I went back and re-excised both the entire length of the  superior and inferior margins, which really ended up giving [place] an  entirety margin circumferentially.  I labeled the new margins both with  a suture and some blue ink.   I made sure everything was dry.  I injected Marcaine to help with postop  analgesia.  I closed with some 3-0 Vicryl, 4-0 Monocryl and Dermabond.   Attention was turned back to the axillary incision.  I irrigated and  made sure it was dry.  I placed some Marcaine here and then closed with  Vicryl and Monocryl and some Dermabond.  Pathology reported that the two  lymph nodes were negative.   The patient had sterile dressings applied.  She tolerated the procedure  well.  There were no operative complications.  All counts were correct.      Currie Paris, M.D.  Electronically Signed     CJS/MEDQ  D:   04/06/2007  T:  04/06/2007  Job:  [id number]   cc:   Heather Roberts, M.D.  Jeralyn Ruths, MD

## 2011-01-15 NOTE — Op Note (Signed)
NAME:  Angela [name], LESH NO.:  [id number]   MEDICAL RECORD NO.:  [id number]          PATIENT TYPE:  AMB   LOCATION:  DSC                          FACILITY:  [facility]   PHYSICIAN:  Currie Paris, M.D.DATE OF BIRTH:  1941-01-17   DATE OF PROCEDURE:  07/04/2008  DATE OF DISCHARGE:                               OPERATIVE REPORT   PREOPERATIVE DIAGNOSIS:  Unneeded Port-A-Cath.   POSTOPERATIVE DIAGNOSIS:  Unneeded Port-A-Cath.   OPERATION:  Port-A-Cath removal surgery.   SURGEON:  Currie Paris, MD   ANESTHESIA:  MAC.   CLINICAL HISTORY:  This is a 71 year old lady who has completed her  chemo and wished to have her port removed.  She wished to have IV  sedation rather than straight local.   DESCRIPTION OF PROCEDURE:  The patient was seen in the holding area and  had no further questions.  We confirmed Port-A-Cath removal as the  planned procedure.   The patient was taken to the operating room and after satisfactory IV  sedation, the upper chest was prepped and draped as a sterile field.  The time-out was done.   I infiltrated the area around the port with 1% Xylocaine plain.  I then  made an incision using the old scar.  With some retractors, I was able  to identify and opened the capsule around the port and removed the 3  holding sutures of Prolene.  With that was done, I was able to back the  catheter out of small amount and then put a figure-of-eight 4-0 Vicryl  suture along the tract, so there would be no backbleeding.  The catheter  was removed.   The incision was closed with 3-0 Vicryl, 4-0 Monocryl subcuticular and  Dermabond.  The patient tolerated the procedure well.  There no  complications.      Currie Paris, M.D.  Electronically Signed     CJS/MEDQ  D:  07/04/2008  T:  07/04/2008  Job:  [id number]

## 2011-01-18 NOTE — Procedures (Signed)
[facility]  Patient:    Angela [name], Angela [name]                    MRN: [id number] Proc. Date: 05/07/00 Adm. Date:  [id number] Attending:  Benny Lennert CC:         Barbette Hair. Arlyce Dice, M.D. [facility]  Amil Amen L. Lenon Ahmadi, M.D.   Procedure Report  REFERRING PHYSICIAN:  Amil Amen L. Lenon Ahmadi, M.D.  PROCEDURE:  Esophagogastroduodenoscopy with biopsies, Savary dilation over a guidewire and fluoroscopy.  ENDOSCOPIST:  Venita Lick. Pleas Koch., M.D.  INDICATION:  [age]-year-old African-American, previously evaluated by Dr. Barbette Hair. Arlyce Dice, with history of a peptic stricture, who relates worsening dysphagia to solid foods and pills.  Over the past several days, she has regurgitated several pill fragments and is concerned today that a pill is lodged.  She presents via the emergency room and the endoscopy is scheduled urgently.  PHYSICAL EXAMINATION:  CHEST:  Clear to auscultation and percussion.  CARDIAC: Regular rate and rhythm, without murmurs.  NEUROLOGIC:  Alert and oriented x 3.  ANESTHESIA:  Fentanyl 100 mcg IV, Versed 10 mg IV and pharyngeal Cetacaine.  MONITORING:  Automated blood pressure monitor, pulse oximeter and cardiac monitor.  Low-flow oxygen was given by nasal cannula throughout the procedure. The procedure was well-tolerated with no immediate complications.  DESCRIPTION OF PROCEDURE:  After the nature of the procedure was discussed with the patient including discussion of its risks, benefits and alternatives, she consented to proceed.  She was then brought to the fluoroscopy suite and comfortably sedated in the left lateral decubitus position.  The Olympus video endoscope was inserted in the posterior pharynx and esophagus was intubated under direct visualization.  There was an erythematous prominence with a vascular pattern in the distal esophagus.  At 37 cm from the incisors, there was a ringlike fibrotic and benign-appearing stricture.  The distal  esophagus and the stricture were video-photographed.  Biopsies were obtained in the stricture.  Sliding hiatal hernia was noted in the gastric cardia on forward and retroflexed view.  Examination of the gastric body was unremarkable. Retroflexed view of the angularis, lesser curvature and fundus was unremarkable.  In the gastric antrum, patchy erythema was noted.  The gastric pylorus, duodenal bulb and descending duodenum appeared unremarkable.  The endoscope was withdrawn into the stomach and a soft-tipped guidewire was placed into the antrum.  The endoscope was removed over the guidewire under fluoroscopic control.  The 14-mm, 15-mm and 16-mm Savary dilators were passed over the guidewire and across the stricture under fluoroscopic control. Minimal resistance was noted to each dilator.  The last dilator and guidewire were removed together from the patient.  IMPRESSION 1. Benign-appearing distal esophageal stricture. 2. Sliding hiatal hernia. 3. Nonerosive antral gastritis. 4. Savary dilation over a guidewire. 5. Fluoroscopy for dilation.  RECOMMENDATION: 1. Await biopsies and treat accordingly. 2. Chronic proton pump inhibitor of choice and chronic antireflux measures. 3. Clear liquids for three hours, then resume antireflux diet. 4. Return office visit with Dr. Barbette Hair. Arlyce Dice. DD:  05/07/00 TD:  05/08/00 Job: [id number]

## 2011-06-05 ENCOUNTER — Ambulatory Visit: Payer: Medicare Other | Admitting: *Deleted

## 2011-06-05 VITALS — Ht 65.0 in | Wt 197.6 lb

## 2011-06-05 DIAGNOSIS — Z1211 Encounter for screening for malignant neoplasm of colon: Secondary | ICD-10-CM

## 2011-06-05 MED ORDER — PEG-KCL-NACL-NASULF-NA ASC-C 100 G PO SOLR: ORAL | Status: DC

## 2011-06-06 ENCOUNTER — Telehealth: Payer: Self-pay | Admitting: *Deleted

## 2011-06-06 NOTE — Telephone Encounter (Signed)
Dr. Arlyce Dice-- pt had colonoscopy with you in 1999.  Since that time, she had colonoscopy with Dr. Matthias Hughs in 2005 and is now returning to have recall colonoscopy with you.  Dr. Donavan Burnet report says that it was a technically difficult exam and would recommend using Propofol for future exams.  I have scheduled colonoscopy with Propofol sedation. Records are on your desk for review.  Thanks. Ezra Sites

## 2011-06-06 NOTE — Telephone Encounter (Signed)
Records have been reviewed by Dr. Arlyce Dice. Angela [name]

## 2011-06-13 ENCOUNTER — Encounter: Payer: Self-pay | Admitting: Gastroenterology

## 2011-06-13 ENCOUNTER — Ambulatory Visit (AMBULATORY_SURGERY_CENTER): Payer: Medicare Other | Admitting: Gastroenterology

## 2011-06-13 VITALS — BP 150/78 | HR 73 | Temp 97.4°F | Resp 19 | Ht 65.0 in | Wt 197.0 lb

## 2011-06-13 DIAGNOSIS — Z86010 Personal history of colonic polyps: Secondary | ICD-10-CM

## 2011-06-13 DIAGNOSIS — K5730 Diverticulosis of large intestine without perforation or abscess without bleeding: Secondary | ICD-10-CM

## 2011-06-13 DIAGNOSIS — Z1211 Encounter for screening for malignant neoplasm of colon: Secondary | ICD-10-CM

## 2011-06-13 DIAGNOSIS — K648 Other hemorrhoids: Secondary | ICD-10-CM

## 2011-06-13 MED ORDER — SODIUM CHLORIDE 0.9 % IV SOLN: 500.0000 mL | INTRAVENOUS | Status: DC

## 2011-06-14 ENCOUNTER — Other Ambulatory Visit: Payer: Self-pay | Admitting: Hematology and Oncology

## 2011-06-14 ENCOUNTER — Telehealth: Payer: Self-pay | Admitting: *Deleted

## 2011-06-14 ENCOUNTER — Encounter (HOSPITAL_BASED_OUTPATIENT_CLINIC_OR_DEPARTMENT_OTHER): Payer: Medicare Other | Admitting: Hematology and Oncology

## 2011-06-14 DIAGNOSIS — Z170 Estrogen receptor positive status [ER+]: Secondary | ICD-10-CM

## 2011-06-14 DIAGNOSIS — C50319 Malignant neoplasm of lower-inner quadrant of unspecified female breast: Secondary | ICD-10-CM

## 2011-06-14 LAB — CBC WITH DIFFERENTIAL/PLATELET
BASO%: 0.4 % (ref 0.0–2.0)
Basophils Absolute: 0 10*3/uL (ref 0.0–0.1)
Eosinophils Absolute: 0.1 10*3/uL (ref 0.0–0.5)
HCT: 39.8 % (ref 34.8–46.6)
LYMPH%: 50.6 % — ABNORMAL HIGH (ref 14.0–49.7)
MCHC: 34.1 g/dL (ref 31.5–36.0)
MONO#: 0.3 10*3/uL (ref 0.1–0.9)
NEUT%: 35.8 % — ABNORMAL LOW (ref 38.4–76.8)
Platelets: 210 10*3/uL (ref 145–400)
WBC: 3 10*3/uL — ABNORMAL LOW (ref 3.9–10.3)

## 2011-06-14 LAB — LACTATE DEHYDROGENASE: LDH: 133 U/L (ref 94–250)

## 2011-06-14 LAB — COMPREHENSIVE METABOLIC PANEL
ALT: 13 U/L (ref 0–35)
CO2: 24 mEq/L (ref 19–32)
Calcium: 8.8 mg/dL (ref 8.4–10.5)
Chloride: 107 mEq/L (ref 96–112)
Creatinine, Ser: 0.74 mg/dL (ref 0.50–1.10)
Glucose, Bld: 104 mg/dL — ABNORMAL HIGH (ref 70–99)
Total Bilirubin: 0.4 mg/dL (ref 0.3–1.2)

## 2011-06-14 LAB — CANCER ANTIGEN 27.29: CA 27.29: 20 U/mL (ref 0–39)

## 2011-06-14 NOTE — Telephone Encounter (Signed)

## 2011-06-17 LAB — DIFFERENTIAL
Basophils Relative: 0
Eosinophils Absolute: 0.1
Monocytes Relative: 10
Neutrophils Relative %: 39 — ABNORMAL LOW

## 2011-06-17 LAB — COMPREHENSIVE METABOLIC PANEL
ALT: 18
Alkaline Phosphatase: 69
CO2: 27
Chloride: 109
GFR calc non Af Amer: >60
Glucose, Bld: 101 — ABNORMAL HIGH
Potassium: 4.4
Sodium: 140
Total Bilirubin: 0.6

## 2011-06-17 LAB — URINALYSIS, ROUTINE W REFLEX MICROSCOPIC
Bilirubin Urine: NEGATIVE
Glucose, UA: NEGATIVE
Ketones, ur: NEGATIVE
Nitrite: NEGATIVE
Protein, ur: NEGATIVE
pH: 6

## 2011-06-17 LAB — CBC
Hemoglobin: 13.6
RBC: 4.65

## 2011-06-17 LAB — POCT HEMOGLOBIN-HEMACUE: Hemoglobin: 15.8 — ABNORMAL HIGH

## 2011-06-18 ENCOUNTER — Encounter (HOSPITAL_BASED_OUTPATIENT_CLINIC_OR_DEPARTMENT_OTHER): Payer: Medicare Other | Admitting: Hematology and Oncology

## 2011-06-18 DIAGNOSIS — Z170 Estrogen receptor positive status [ER+]: Secondary | ICD-10-CM

## 2011-06-18 DIAGNOSIS — C50319 Malignant neoplasm of lower-inner quadrant of unspecified female breast: Secondary | ICD-10-CM

## 2011-06-19 ENCOUNTER — Other Ambulatory Visit: Payer: Medicare Other | Admitting: Gastroenterology

## 2011-06-26 ENCOUNTER — Telehealth: Payer: Self-pay | Admitting: Hematology and Oncology

## 2011-06-26 ENCOUNTER — Other Ambulatory Visit: Payer: Medicare Other | Admitting: Gastroenterology

## 2011-06-26 NOTE — Telephone Encounter (Signed)
Mailed pt appts for 12/17/11 lab @ 2.15p and 12/20/11 md visit @ 11am.

## 2011-10-08 ENCOUNTER — Encounter: Payer: Self-pay | Admitting: *Deleted

## 2011-12-17 ENCOUNTER — Other Ambulatory Visit (HOSPITAL_BASED_OUTPATIENT_CLINIC_OR_DEPARTMENT_OTHER): Payer: Medicare Other | Admitting: Lab

## 2011-12-17 DIAGNOSIS — C773 Secondary and unspecified malignant neoplasm of axilla and upper limb lymph nodes: Secondary | ICD-10-CM

## 2011-12-17 DIAGNOSIS — C801 Malignant (primary) neoplasm, unspecified: Secondary | ICD-10-CM

## 2011-12-17 DIAGNOSIS — C50319 Malignant neoplasm of lower-inner quadrant of unspecified female breast: Secondary | ICD-10-CM

## 2011-12-17 DIAGNOSIS — Z79899 Other long term (current) drug therapy: Secondary | ICD-10-CM

## 2011-12-17 LAB — COMPREHENSIVE METABOLIC PANEL
ALT: 13 U/L (ref 0–35)
AST: 17 U/L (ref 0–37)
Alkaline Phosphatase: 85 U/L (ref 39–117)
CO2: 30 mEq/L (ref 19–32)
Sodium: 142 mEq/L (ref 135–145)
Total Bilirubin: 0.4 mg/dL (ref 0.3–1.2)
Total Protein: 6.6 g/dL (ref 6.0–8.3)

## 2011-12-17 LAB — CBC WITH DIFFERENTIAL/PLATELET
Eosinophils Absolute: 0.1 10*3/uL (ref 0.0–0.5)
MCV: 91.4 fL (ref 79.5–101.0)
MONO%: 8.5 % (ref 0.0–14.0)
NEUT#: 1.2 10*3/uL — ABNORMAL LOW (ref 1.5–6.5)
RBC: 4.51 10*6/uL (ref 3.70–5.45)
RDW: 14.1 % (ref 11.2–14.5)
WBC: 3.6 10*3/uL — ABNORMAL LOW (ref 3.9–10.3)
nRBC: 0 % (ref 0–0)

## 2011-12-17 LAB — LACTATE DEHYDROGENASE: LDH: 142 U/L (ref 94–250)

## 2011-12-20 ENCOUNTER — Ambulatory Visit (HOSPITAL_BASED_OUTPATIENT_CLINIC_OR_DEPARTMENT_OTHER): Payer: Medicare Other | Admitting: Hematology and Oncology

## 2011-12-20 ENCOUNTER — Ambulatory Visit: Payer: Medicare Other | Admitting: Hematology and Oncology

## 2011-12-20 ENCOUNTER — Encounter: Payer: Self-pay | Admitting: *Deleted

## 2011-12-20 ENCOUNTER — Encounter: Payer: Self-pay | Admitting: Hematology and Oncology

## 2011-12-20 ENCOUNTER — Telehealth: Payer: Self-pay | Admitting: Hematology and Oncology

## 2011-12-20 ENCOUNTER — Encounter: Payer: Medicare Other | Admitting: *Deleted

## 2011-12-20 VITALS — BP 111/70 | HR 71 | Temp 98.2°F | Ht 65.0 in | Wt 199.3 lb

## 2011-12-20 DIAGNOSIS — C50319 Malignant neoplasm of lower-inner quadrant of unspecified female breast: Secondary | ICD-10-CM

## 2011-12-20 DIAGNOSIS — H409 Unspecified glaucoma: Secondary | ICD-10-CM | POA: Insufficient documentation

## 2011-12-20 DIAGNOSIS — E7800 Pure hypercholesterolemia, unspecified: Secondary | ICD-10-CM | POA: Insufficient documentation

## 2011-12-20 DIAGNOSIS — M8580 Other specified disorders of bone density and structure, unspecified site: Secondary | ICD-10-CM

## 2011-12-20 DIAGNOSIS — M1990 Unspecified osteoarthritis, unspecified site: Secondary | ICD-10-CM | POA: Insufficient documentation

## 2011-12-20 DIAGNOSIS — C50919 Malignant neoplasm of unspecified site of unspecified female breast: Secondary | ICD-10-CM

## 2011-12-20 DIAGNOSIS — Z170 Estrogen receptor positive status [ER+]: Secondary | ICD-10-CM

## 2011-12-20 NOTE — Progress Notes (Signed)
Patient in to clinic today for follow-up visit at 42 months from end of treatment. Patient states her overall health is good and her vision is much improved following her eyelid surgery. Patient aware that next study visit will occur in six months. At that time, research blood samples will be collected and study-reimbursed echocardiogram will be performed at [facility] cardiology office. Patient voiced agreement with plan.

## 2011-12-20 NOTE — Telephone Encounter (Signed)
appts made and printed for pt aom °

## 2011-12-20 NOTE — Progress Notes (Signed)
IDENTIFYING STATEMENT:  The patient is a 71 year old woman with breast cancer who presents for followup.  INTERVAL HISTORY:  Angela [name] was seen 6 months ago and has had no major issues or concerns since her last followup visit.  She is tolerating Arimidex with very minimal difficulty and denies nausea, diarrhea.  She has not noted any new breast lumps or lesions.  She is enjoying good energy levels.  Her last bone density test was in '07. The patient also notes that she received a colonoscopy on 06/13/2011 that was unremarkable.  MEDICATIONS:  Arimidex 1 mg daily.  Other medicines reviewed and updated.  ALLERGIES:  Pravachol.  Past medical history, family history and social history unchanged.  REVIEW OF SYSTEMS:  Negative.  PHYSICAL EXAMINATION:  General:  The patient is alert and oriented times 3.  Vitals:  Pulse 71, blood pressure 101/70, temperature 98.6, ECOG - 0 respirations 20, weight 191 pounds.  HEENT:  Head is atraumatic and normocephalic.  Sclerae anicteric.  Mouth moist.  Neck:  Supple.  Chest: Clear.  CVS:  Unremarkable.  Abdomen:  Soft, nontender.  Bowel sounds present.  Extremities:  No edema.  Breasts:  Notes no new breast lumps, lesions or nipple discharge.  Lymph nodes:  No adenopathy.  CNS: Nonfocal.  LAB DATA:  On 12/17/2011 white cell count 3.6, hemoglobin 13.9, hematocrit 41.3, platelets 214, sodium 142, potassium 4, chloride 104, CO2 30, BUN 10, creatinine 0.71, T bilirubin 0.4, alkaline phosphatase 85, AST 17, ALT 13, LDH 142, calcium 9.4.  CA27.29 23.  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  The tumor was ER/PR positive and HER2/neu positive.  She was enrolled in the [facility] trial initiating chemotherapy in the form of Taxotere and carboplatin and Avastin with Herceptin on 06/2007.  She completed  chemotherapy on 09/17/2007.  She also received radiation therapy from 10/29/2007 through 12/14/2007.  She continued Herceptin and Avastin and was last treated on 05/26/2008.  She began Arimidex on 12/20/2007 and continues to tolerate with very minimal toxicities and she will continue.  She follow up in 6  months' time with a DEXA scan.  She is also due for an echocardiogram at  that time.    ______________________________ Laurice Record, M.D. LIO/MEDQ  D:  12/20/2011  T:  12/20/2011  Job:  [id number]

## 2011-12-20 NOTE — Patient Instructions (Signed)
Patient to follow up as instructed.   Current Outpatient Prescriptions  Medication Sig Dispense Refill  . anastrozole (ARIMIDEX) 1 MG tablet Take 1 mg by mouth daily.       . Calcium Carbonate-Vitamin D (CALCIUM + D PO) Take by mouth. Take as needed.      . fish oil-omega-3 fatty acids 1000 MG capsule Take 1 capsule by mouth as needed. Fish oil 1200 mg + Vit D 1000 IU. Take 1 tab as needed.      Marland Kitchen LOTEMAX 0.5 % ophthalmic suspension Place 2 drops into the right eye 2 (two) times daily.       Marland Kitchen omeprazole (PRILOSEC) 20 MG capsule Take 20 mg by mouth 2 (two) times daily.       . timolol (TIMOPTIC-XR) 0.5 % ophthalmic gel-forming Place 1 drop into the right eye daily.       . traMADol (ULTRAM) 50 MG tablet Take 50 mg by mouth every 6 (six) hours as needed.              April 2013  Sunday Monday Tuesday Wednesday Thursday Friday Saturday      1   2   3   4   5   6    7   8   9   10   11   12   13    14   15   16    LAB MO     2:15 PM  (15 min.)  Radene Gunning  [facility] 17   18   19    EST PT 15  11:00 AM  (15 min.)  Laurice Record, MD  [facility]   RES GEN 30  11:30 AM  (30 min.)  Bernerd Pho, RN  [facility] 20    21   22   23   24   25   26   27    28   29    30

## 2011-12-20 NOTE — Progress Notes (Signed)
This office note has been dictated.

## 2011-12-26 ENCOUNTER — Telehealth: Payer: Self-pay | Admitting: Hematology and Oncology

## 2011-12-26 NOTE — Telephone Encounter (Signed)
PER 4/22 POF FROM CINDY IN RESEARCH 10/8 ECHO MOVED FROM [facility] TO [employer]. S/W PT TODAY SHE IS AWARE AND HAS NEW D/T/LOCATION FOR 10/8 ECHO @ [facility]. OTHER APPTS REMAIN UNCHANGED.

## 2011-12-27 ENCOUNTER — Other Ambulatory Visit: Payer: Self-pay | Admitting: Nurse Practitioner

## 2011-12-27 MED ORDER — ANASTROZOLE 1 MG PO TABS: 1.0000 mg | ORAL_TABLET | Freq: Every day | ORAL | Status: DC

## 2012-05-20 ENCOUNTER — Telehealth: Payer: Self-pay | Admitting: Hematology and Oncology

## 2012-05-20 NOTE — Telephone Encounter (Signed)
LVM for pt advising of appt d.t. change...called and advised Angela [name] in research of appt d.t. change...mailed updated appt schedule to pt.

## 2012-06-04 ENCOUNTER — Telehealth: Payer: Self-pay | Admitting: *Deleted

## 2012-06-04 NOTE — Telephone Encounter (Signed)
Called patient after receiving voice mail message from patient to contact her. Patient said she received a mailing about appointments on October 8th, but she did not understand the codes and didn't know what the test was for or where to go. Explained that she had [facility] at 8:30, followed by echocardiogram at [facility] at 11:30am the same day. Patient stated she had appointments at [facility] on 10/16, however, per current schedule in [place], those appointments were moved to 10/15. New appointment times were given to patient, noting that she will be seen by Dr. Lonell Face mid-level provider on 10/15 following her lab appointment.

## 2012-06-09 ENCOUNTER — Ambulatory Visit: Payer: Medicare Other

## 2012-06-09 ENCOUNTER — Ambulatory Visit (HOSPITAL_COMMUNITY): Payer: Medicare Other

## 2012-06-09 ENCOUNTER — Ambulatory Visit (HOSPITAL_COMMUNITY): Payer: Medicare Other | Attending: Cardiology | Admitting: Radiology

## 2012-06-09 DIAGNOSIS — I428 Other cardiomyopathies: Secondary | ICD-10-CM | POA: Insufficient documentation

## 2012-06-09 DIAGNOSIS — C50919 Malignant neoplasm of unspecified site of unspecified female breast: Secondary | ICD-10-CM

## 2012-06-09 NOTE — Progress Notes (Signed)
Echocardiogram performed.  

## 2012-06-15 ENCOUNTER — Other Ambulatory Visit: Payer: Self-pay | Admitting: *Deleted

## 2012-06-15 ENCOUNTER — Other Ambulatory Visit: Payer: Self-pay | Admitting: Family

## 2012-06-15 DIAGNOSIS — C50919 Malignant neoplasm of unspecified site of unspecified female breast: Secondary | ICD-10-CM

## 2012-06-15 NOTE — Progress Notes (Signed)
Research lab order updated to current test order ("Other [facility] Test" order changed to "Research Labs"). No response from e-mail to Dr. Dalene Carrow last week regarding any additional labs MD would like ordered per standard of care, therefore, voice mail message left today for Dr. Lonell Face nurse to clarify if any additional tests are desired. Noted that no labs are required per protocol in follow-up, other than serum and plasma research samples to be shipped to central lab.

## 2012-06-16 ENCOUNTER — Other Ambulatory Visit (HOSPITAL_BASED_OUTPATIENT_CLINIC_OR_DEPARTMENT_OTHER): Payer: Medicare Other | Admitting: Lab

## 2012-06-16 ENCOUNTER — Telehealth: Payer: Self-pay | Admitting: Hematology and Oncology

## 2012-06-16 ENCOUNTER — Encounter: Payer: Medicare Other | Admitting: *Deleted

## 2012-06-16 ENCOUNTER — Encounter: Payer: Self-pay | Admitting: Family

## 2012-06-16 ENCOUNTER — Ambulatory Visit (HOSPITAL_BASED_OUTPATIENT_CLINIC_OR_DEPARTMENT_OTHER): Payer: Medicare Other | Admitting: Family

## 2012-06-16 VITALS — BP 115/72 | HR 77 | Temp 98.7°F | Resp 20 | Ht 65.0 in | Wt 203.1 lb

## 2012-06-16 DIAGNOSIS — C50319 Malignant neoplasm of lower-inner quadrant of unspecified female breast: Secondary | ICD-10-CM

## 2012-06-16 DIAGNOSIS — C50919 Malignant neoplasm of unspecified site of unspecified female breast: Secondary | ICD-10-CM

## 2012-06-16 DIAGNOSIS — Z170 Estrogen receptor positive status [ER+]: Secondary | ICD-10-CM

## 2012-06-16 LAB — CBC WITH DIFFERENTIAL/PLATELET
Basophils Absolute: 0 10*3/uL (ref 0.0–0.1)
Eosinophils Absolute: 0.1 10*3/uL (ref 0.0–0.5)
HCT: 40 % (ref 34.8–46.6)
HGB: 13.6 g/dL (ref 11.6–15.9)
LYMPH%: 57.7 % — ABNORMAL HIGH (ref 14.0–49.7)
MCV: 89.7 fL (ref 79.5–101.0)
MONO%: 5 % (ref 0.0–14.0)
NEUT#: 1.3 10*3/uL — ABNORMAL LOW (ref 1.5–6.5)
NEUT%: 34.4 % — ABNORMAL LOW (ref 38.4–76.8)
Platelets: 204 10*3/uL (ref 145–400)

## 2012-06-16 LAB — COMPREHENSIVE METABOLIC PANEL (CC13)
CO2: 23 mEq/L (ref 22–29)
Calcium: 9.5 mg/dL (ref 8.4–10.4)
Creatinine: 0.7 mg/dL (ref 0.6–1.1)
Glucose: 133 mg/dl — ABNORMAL HIGH (ref 70–99)
Total Bilirubin: 0.3 mg/dL (ref 0.20–1.20)
Total Protein: 6.3 g/dL — ABNORMAL LOW (ref 6.4–8.3)

## 2012-06-16 LAB — RESEARCH LABS

## 2012-06-16 LAB — LACTATE DEHYDROGENASE (CC13): LDH: 172 U/L (ref 125–220)

## 2012-06-16 NOTE — Telephone Encounter (Signed)
appts made and printed for pt aom °

## 2012-06-16 NOTE — Progress Notes (Signed)
Patient ID: Angela [name], female   DOB: December 07, 1940, 71 y.o.   MRN: [id number] CSN: [id number]  Cc: Angela Parker, MD  Identifying Statement: Angela [name] is a 71 y.o. African-American female with a history of breast cancer who presents for follow up.   Interval History: Angela [name] is a 71 year-old African-American female with a history of breast cancer who presents for follow up.  The patient is a part of a research study of Arimidex (started 12/10/2007)  and Angela [name] , Research RN was present for today's office visit.  The patient was last seen in our office on 12/20/2011 by Dr. Dalene [name].  Since her last office visit, the patient states that she has had right ankle discomfort for the past 2 months.  Dr. Lovell [name] is following the patient for this pain. The patient states that her last mammogram was 04/06/2012. We have not received this written report.  Angela [name] had a 2D Echo performed on 06/09/2012 which showed a EF of 50-55%, grade 1 systolic dysfunction.  According to NYHA classification a grade 1 systolic dysfunction is classified as mild and without objective evidence of cardiovascular disease.  Angela [name] completed a bone density scan on 06/15/2012 and the results are pending.  The patient denies any other symptomatology including fever, chills, SOB, any new nodules, unusual bleeding, N/V/D or constipation and night sweats. Angela [name] reports a good energy level and a great appetite.   Medications: Current Outpatient Prescriptions  Medication Sig Dispense Refill  . anastrozole (ARIMIDEX) 1 MG tablet Take 1 tablet (1 mg total) by mouth daily.  90 tablet  1  . Calcium Carbonate-Vitamin D (CALCIUM + D PO) Take by mouth. Take as needed.      . fish oil-omega-3 fatty acids 1000 MG capsule Take 1 capsule by mouth as needed. Fish oil 1200 mg + Vit D 1000 IU. Take 1 tab as needed.      Marland Kitchen LOTEMAX 0.5 % ophthalmic suspension Place 2 drops into the right eye 2 (two) times  daily.       Marland Kitchen omeprazole (PRILOSEC) 20 MG capsule Take 20 mg by mouth 2 (two) times daily.       . timolol (TIMOPTIC-XR) 0.5 % ophthalmic gel-forming Place 1 drop into the right eye daily.       . traMADol (ULTRAM) 50 MG tablet Take 50 mg by mouth every 6 (six) hours as needed.          Allergies: Allergies  Allergen Reactions  . Pravachol     Reported in 2005    Past Medical History: Past Medical History  Diagnosis Date  . Arthritis   . Blindness     90%  . Glaucoma(365)   . GERD (gastroesophageal reflux disease)   . Hyperlipidemia   . Irritable bowel syndrome (IBS)   . Mitral valve prolapse   . Herpes simplex type II infection 1999  . Myasthenia gravis   . Breast cancer 03/19/2007    right     Family History: History reviewed. No pertinent family history.  Social History: History  Substance Use Topics  . Smoking status: Never Smoker   . Smokeless tobacco: Never Used  . Alcohol Use: No    Review of Systems: 10 point review of systems was completed and is negative except as noted above.   Physical Exam: Blood pressure 115/72, pulse 77, temperature 98.7 F (37.1 C), temperature source Oral, resp. rate 20, height 5\' 5"  (1.651 m), weight  203 lb 1.6 oz (92.126 kg). ECOG - 0  General appearance: Alert, cooperative, well nourished, no apparent distress Head: Normocephalic, without obvious abnormality, atraumatic Eyes:  Conjunctivae, right cornea is cloudy clear, PERRLA, EOMI Nose: Nares, septum and mucosa are normal, no drainage or sinus tenderness Neck: No adenopathy, supple, symmetrical, trachea midline, thyroid not enlarged, no tenderness Resp: Clear to auscultation bilaterally, diminished bibasilar breath sounds Breasts: Well healed surgical scars, normal appearance, no masses, lesions, discharge or tenderness Cardio: Regular rate and rhythm, S1, S2 normal, no murmur, click, rub or gallop GI: Soft, distended, non-tender, hypoactive bowel sounds, no  organomegaly Extremities: Extremities normal, atraumatic, no cyanosis or edema, left ankle tenderness Lymph nodes: Cervical, supraclavicular, and axillary nodes normal. Neurological:  Grossly normal   Laboratory Data: Results for orders placed in visit on 06/16/12 (from the past 48 hour(s))  RESEARCH LABS     Status: Normal   Collection Time   06/16/12  1:33 PM      Component Value Range Comment   Research Labs Collected.     CBC WITH DIFFERENTIAL     Status: Abnormal   Collection Time   06/16/12  1:33 PM      Component Value Range Comment   WBC 3.8 (*) 3.9 - 10.3 10e3/uL    NEUT# 1.3 (*) 1.5 - 6.5 10e3/uL    HGB 13.6  11.6 - 15.9 g/dL    HCT 40.9  81.1 - 91.4 %    Platelets 204  145 - 400 10e3/uL    MCV 89.7  79.5 - 101.0 fL    MCH 30.5  25.1 - 34.0 pg    MCHC 34.0  31.5 - 36.0 g/dL    RBC 7.82  9.56 - 2.13 10e6/uL    RDW 13.7  11.2 - 14.5 %    lymph# 2.2  0.9 - 3.3 10e3/uL    MONO# 0.2  0.1 - 0.9 10e3/uL    Eosinophils Absolute 0.1  0.0 - 0.5 10e3/uL    Basophils Absolute 0.0  0.0 - 0.1 10e3/uL    NEUT% 34.4 (*) 38.4 - 76.8 %    LYMPH% 57.7 (*) 14.0 - 49.7 %    MONO% 5.0  0.0 - 14.0 %    EOS% 2.6  0.0 - 7.0 %    BASO% 0.3  0.0 - 2.0 %    nRBC 0  0 - 0 %   COMPREHENSIVE METABOLIC PANEL (CC13)     Status: Abnormal   Collection Time   06/16/12  1:33 PM      Component Value Range Comment   Sodium 141  136 - 145 mEq/L    Potassium 3.4 (*) 3.5 - 5.1 mEq/L    Chloride 107  98 - 107 mEq/L    CO2 23  22 - 29 mEq/L    Glucose 133 (*) 70 - 99 mg/dl    BUN 08.6  7.0 - 57.8 mg/dL    Creatinine 0.7  0.6 - 1.1 mg/dL    Total Bilirubin 4.69  0.20 - 1.20 mg/dL    Alkaline Phosphatase 87  40 - 150 U/L    AST 16  5 - 34 U/L    ALT 16  0 - 55 U/L    Total Protein 6.3 (*) 6.4 - 8.3 g/dL    Albumin 3.4 (*) 3.5 - 5.0 g/dL    Calcium 9.5  8.4 - 62.9 mg/dL   LACTATE DEHYDROGENASE (CC13)     Status: Normal   Collection Time  06/16/12  1:33 PM      Component Value Range Comment    LDH 172  125 - 220 U/L      Impression/Plan: Angela [name] is a 71 year old African-American female who is status post lumpectomy, axillary lymph node dissection in August 2008 for stage  IIA invasive ductal carcinoma of the right breast. The tumor was ER/PR positive and HER2/neu positive. She was enrolled in the [facility] trial initiating chemotherapy in the form of Taxotere and carboplatin and Avastin with Herceptin on 06/2007. She completed chemotherapy on 09/17/2007. She also received radiation therapy from 10/29/2007 through 12/14/2007. She continued Herceptin and Avastin and was last treated on 05/26/2008. She began Arimidex on 12/10/2007 and continues to tolerate well.  She will continue Arimidex until April 2014.  We plan to see Ms. Eisenhuth again in 6 months' time with laboratories (CMP, CBC, LDH and CA 27.29) to be drawn in advance of her office visit.  Ms. Mates will contact us in the interim with any questions or concerns.     Larina Bras, NP-C 06/16/2012, 2:39 PM

## 2012-06-16 NOTE — Patient Instructions (Addendum)
Please follow up with Dr. Lovell Sheehan if your ankle pain persists beyond this week.  In the meantime elevate your legs as much as possible and warm compresses to your right ankle several times daily.

## 2012-06-16 NOTE — Progress Notes (Signed)
Patient in to clinic today for evaluation at 48 months from end of treatment.  Echocardiogram results reviewed by Dr. Dalene Carrow, noting that left ventricular diastolic dysfunction marked as resolved at 24 months from EOT, was again demonstrated on the current echocardiogram. Per Dr. Dalene Carrow, this grade 1 abnormality is not felt to be related to treatment but to other concurrent illness.   During history and physical exam by mid-level provider Larina Bras, patient denies any fatigue, shortness of breath or palpitations with activity (NYHA class 1).   Patient is aware that while next follow-up visit per study is expected in one year (Year 6), MD prefers evaluation in six months, which corresponds with completion of five years of hormonal therapy. Patient understands that she should contact [facility] if she needs refills of anastrazole to continue therapy through April 2014. It is anticipated that annual f/u visits will then begin in October 2014. Patient is in agreement with this plan.

## 2012-06-17 ENCOUNTER — Other Ambulatory Visit: Payer: Medicare Other | Admitting: Lab

## 2012-06-17 ENCOUNTER — Ambulatory Visit: Payer: Medicare Other | Admitting: Hematology and Oncology

## 2012-06-17 LAB — CANCER ANTIGEN 27.29: CA 27.29: 25 U/mL (ref 0–39)

## 2012-06-19 ENCOUNTER — Other Ambulatory Visit: Payer: Self-pay | Admitting: Hematology and Oncology

## 2012-06-19 DIAGNOSIS — C50919 Malignant neoplasm of unspecified site of unspecified female breast: Secondary | ICD-10-CM

## 2012-10-03 ENCOUNTER — Telehealth: Payer: Self-pay | Admitting: Oncology

## 2012-10-03 ENCOUNTER — Encounter: Payer: Self-pay | Admitting: Oncology

## 2012-10-03 NOTE — Telephone Encounter (Signed)
Called pt and left message regarding appt with Dr.Ha, a former Dr. Dalene Carrow p

## 2012-11-13 ENCOUNTER — Encounter (HOSPITAL_COMMUNITY): Payer: Self-pay | Admitting: Cardiology

## 2012-11-13 ENCOUNTER — Inpatient Hospital Stay (HOSPITAL_COMMUNITY)
Admission: EM | Admit: 2012-11-13 | Discharge: 2012-11-20 | DRG: 418 | Disposition: A | Discharge status: Home / Self Care | Payer: Medicare Other | Attending: Internal Medicine | Admitting: Internal Medicine

## 2012-11-13 ENCOUNTER — Emergency Department (HOSPITAL_COMMUNITY): Payer: Medicare Other

## 2012-11-13 DIAGNOSIS — Z860100 Personal history of colon polyps, unspecified: Secondary | ICD-10-CM

## 2012-11-13 DIAGNOSIS — K8020 Calculus of gallbladder without cholecystitis without obstruction: Secondary | ICD-10-CM | POA: Diagnosis present

## 2012-11-13 DIAGNOSIS — K8051 Calculus of bile duct without cholangitis or cholecystitis with obstruction: Secondary | ICD-10-CM

## 2012-11-13 DIAGNOSIS — K8050 Calculus of bile duct without cholangitis or cholecystitis without obstruction: Secondary | ICD-10-CM

## 2012-11-13 DIAGNOSIS — M1990 Unspecified osteoarthritis, unspecified site: Secondary | ICD-10-CM

## 2012-11-13 DIAGNOSIS — K8064 Calculus of gallbladder and bile duct with chronic cholecystitis without obstruction: Secondary | ICD-10-CM | POA: Diagnosis present

## 2012-11-13 DIAGNOSIS — Z803 Family history of malignant neoplasm of breast: Secondary | ICD-10-CM

## 2012-11-13 DIAGNOSIS — Z8249 Family history of ischemic heart disease and other diseases of the circulatory system: Secondary | ICD-10-CM

## 2012-11-13 DIAGNOSIS — R17 Unspecified jaundice: Secondary | ICD-10-CM | POA: Diagnosis not present

## 2012-11-13 DIAGNOSIS — K449 Diaphragmatic hernia without obstruction or gangrene: Secondary | ICD-10-CM | POA: Diagnosis present

## 2012-11-13 DIAGNOSIS — G7000 Myasthenia gravis without (acute) exacerbation: Secondary | ICD-10-CM | POA: Diagnosis present

## 2012-11-13 DIAGNOSIS — K8060 Calculus of gallbladder and bile duct with cholecystitis, unspecified, without obstruction: Secondary | ICD-10-CM | POA: Diagnosis present

## 2012-11-13 DIAGNOSIS — R112 Nausea with vomiting, unspecified: Secondary | ICD-10-CM

## 2012-11-13 DIAGNOSIS — E7800 Pure hypercholesterolemia, unspecified: Secondary | ICD-10-CM | POA: Diagnosis present

## 2012-11-13 DIAGNOSIS — K219 Gastro-esophageal reflux disease without esophagitis: Secondary | ICD-10-CM | POA: Diagnosis present

## 2012-11-13 DIAGNOSIS — Z6834 Body mass index (BMI) 34.0-34.9, adult: Secondary | ICD-10-CM

## 2012-11-13 DIAGNOSIS — K838 Other specified diseases of biliary tract: Secondary | ICD-10-CM

## 2012-11-13 DIAGNOSIS — Z853 Personal history of malignant neoplasm of breast: Secondary | ICD-10-CM

## 2012-11-13 DIAGNOSIS — M129 Arthropathy, unspecified: Secondary | ICD-10-CM | POA: Diagnosis present

## 2012-11-13 DIAGNOSIS — R1011 Right upper quadrant pain: Secondary | ICD-10-CM | POA: Diagnosis present

## 2012-11-13 DIAGNOSIS — H409 Unspecified glaucoma: Secondary | ICD-10-CM | POA: Diagnosis present

## 2012-11-13 DIAGNOSIS — E669 Obesity, unspecified: Secondary | ICD-10-CM | POA: Diagnosis present

## 2012-11-13 DIAGNOSIS — H543 Unqualified visual loss, both eyes: Secondary | ICD-10-CM | POA: Diagnosis present

## 2012-11-13 DIAGNOSIS — C50919 Malignant neoplasm of unspecified site of unspecified female breast: Secondary | ICD-10-CM | POA: Diagnosis present

## 2012-11-13 DIAGNOSIS — E785 Hyperlipidemia, unspecified: Secondary | ICD-10-CM | POA: Diagnosis present

## 2012-11-13 DIAGNOSIS — K859 Acute pancreatitis without necrosis or infection, unspecified: Principal | ICD-10-CM | POA: Diagnosis present

## 2012-11-13 HISTORY — DX: Calculus of bile duct without cholangitis or cholecystitis with obstruction: K80.51

## 2012-11-13 LAB — COMPREHENSIVE METABOLIC PANEL
ALT: 477 U/L — ABNORMAL HIGH (ref 0–35)
AST: 670 U/L — ABNORMAL HIGH (ref 0–37)
Albumin: 3.9 g/dL (ref 3.5–5.2)
CO2: 29 mEq/L (ref 19–32)
Calcium: 9.6 mg/dL (ref 8.4–10.5)
Creatinine, Ser: 0.67 mg/dL (ref 0.50–1.10)
GFR calc non Af Amer: 87 mL/min — ABNORMAL LOW (ref >90)
Sodium: 140 mEq/L (ref 135–145)
Total Protein: 7.4 g/dL (ref 6.0–8.3)

## 2012-11-13 LAB — URINALYSIS, ROUTINE W REFLEX MICROSCOPIC
Bilirubin Urine: NEGATIVE
Nitrite: NEGATIVE
Protein, ur: NEGATIVE mg/dL
Specific Gravity, Urine: 1.003 — ABNORMAL LOW (ref 1.005–1.030)
Urobilinogen, UA: 0.2 mg/dL (ref 0.0–1.0)

## 2012-11-13 LAB — URINE MICROSCOPIC-ADD ON

## 2012-11-13 LAB — CBC
MCH: 30.5 pg (ref 26.0–34.0)
Platelets: 220 10*3/uL (ref 150–400)
RBC: 4.89 MIL/uL (ref 3.87–5.11)
RDW: 13.2 % (ref 11.5–15.5)

## 2012-11-13 LAB — LIPASE, BLOOD: Lipase: 81 U/L — ABNORMAL HIGH (ref 11–59)

## 2012-11-13 MED ORDER — SODIUM CHLORIDE 0.9 % IV SOLN: INTRAVENOUS | Status: AC

## 2012-11-13 MED ORDER — MORPHINE SULFATE 4 MG/ML IJ SOLN
4.0000 mg | Freq: Once | INTRAMUSCULAR | Status: AC
  Administered 2012-11-13: 4 mg via INTRAVENOUS
  Filled 2012-11-13: qty 1

## 2012-11-13 MED ORDER — SODIUM CHLORIDE 0.9 % IV BOLUS (SEPSIS)
1000.0000 mL | Freq: Once | INTRAVENOUS | Status: AC
  Administered 2012-11-13: 1000 mL via INTRAVENOUS

## 2012-11-13 MED ORDER — ONDANSETRON HCL 4 MG/2ML IJ SOLN
4.0000 mg | Freq: Once | INTRAMUSCULAR | Status: AC
  Administered 2012-11-13: 4 mg via INTRAVENOUS
  Filled 2012-11-13: qty 2

## 2012-11-13 MED ORDER — HYDROMORPHONE HCL PF 1 MG/ML IJ SOLN
0.5000 mg | INTRAMUSCULAR | Status: AC | PRN
  Administered 2012-11-14: 0.5 mg via INTRAVENOUS
  Filled 2012-11-13: qty 1

## 2012-11-13 MED ORDER — ONDANSETRON HCL 4 MG/2ML IJ SOLN: 4.0000 mg | Freq: Three times a day (TID) | INTRAMUSCULAR | Status: AC | PRN

## 2012-11-13 NOTE — ED Notes (Signed)
Pt reports vomiting for the past 3 weeks. States she is also having back pain with the vomiting. States she went to [facility] about 3 weeks ago and was told she could have kidney infection. States she feels like she is note getting any better.

## 2012-11-13 NOTE — ED Provider Notes (Signed)
History     CSN: [id number]  Arrival date & time 11/13/12  1701   First MD Initiated Contact with Patient 11/13/12 2054      Chief Complaint  Patient presents with  . Emesis  . Back Pain   HPI  History provided by patient and daughter. Patient is a 72 year old female with history of hyperlipidemia, IBS, mitral valve prolapse who presents with complaints of abdominal discomfort and episodes of vomiting for the past several weeks. Patient states she was evaluated for symptoms 2 weeks ago and told she may have a kidney infection. She was given a dose of intramuscular antibiotics as well as prescriptions for Macrobid which she has been taking for the past 7 days. Patient states symptoms have continued with upper abdominal discomfort radiating around to the back and occasionally up to the right shoulder and neck area on the back. Symptoms seem worse and some positions but she denies any other aggravating or alleviating factors. Denies any increased symptoms with eating. Patient does report that she has had decreased appetite and eating discussed no increased nausea and occasional vomiting. She has been able to keep down some fluids and reports normal urination without burning or frequency. She denies any fever but reports some subjective chills. Denies any other associated symptoms.    Past Medical History  Diagnosis Date  . Arthritis   . Blindness     90%  . Glaucoma(365)   . GERD (gastroesophageal reflux disease)   . Hyperlipidemia   . Irritable bowel syndrome (IBS)   . Mitral valve prolapse   . Herpes simplex type II infection 1999  . Myasthenia gravis   . Breast cancer 03/19/2007    right    Past Surgical History  Procedure Laterality Date  . Tonsillectomy  72 YEARS OLD  . Varicose vein surgery  72S  . Eye surgery  1993  . Knee surgery  2011  . Total abdominal hysterectomy w/ bilateral salpingoophorectomy    . Breast lumpectomy  04/06/2007    right  . Axillary node dissection   05/06/2007    right    Family History  Problem Relation Age of Onset  . Cancer Mother   . Hypertension Sister     History  Substance Use Topics  . Smoking status: Never Smoker   . Smokeless tobacco: Never Used  . Alcohol Use: No    OB History   Grav Para Term Preterm Abortions TAB SAB Ect Mult Living                  Review of Systems  Constitutional: Positive for chills and appetite change. Negative for fever and diaphoresis.  Respiratory: Negative for cough.   Cardiovascular: Negative for chest pain and palpitations.  Gastrointestinal: Positive for nausea, vomiting and abdominal pain. Negative for diarrhea, constipation and blood in stool.  Musculoskeletal: Positive for back pain.  All other systems reviewed and are negative.    Allergies  Pravachol  Home Medications   Current Outpatient Rx  Name  Route  Sig  Dispense  Refill  . anastrozole (ARIMIDEX) 1 MG tablet   Oral   Take 1 mg by mouth daily.         . Calcium Carbonate-Vitamin D (CALCIUM + D PO)   Oral   Take 1 tablet by mouth 2 (two) times a week. Days of the week varies - holds for loose stools         . fish oil-omega-3 fatty acids 1000 MG  capsule   Oral   Take 1 capsule by mouth daily. Fish oil 1200 mg + Vit D 1000  Holds for loose stools         . LOTEMAX 0.5 % ophthalmic suspension   Right Eye   Place 2 drops into the right eye 2 (two) times daily.          . Multiple Vitamin (MULTIVITAMIN WITH MINERALS) TABS   Oral   Take 1 tablet by mouth daily.         Marland Kitchen omeprazole (PRILOSEC) 20 MG capsule   Oral   Take 20 mg by mouth daily.          . timolol (TIMOPTIC) 0.5 % ophthalmic solution   Right Eye   Place 1 drop into the right eye every evening.         . traMADol (ULTRAM) 50 MG tablet   Oral   Take 50 mg by mouth daily as needed for pain.            BP 114/72  Pulse 93  Temp(Src) 98.8 F (37.1 C) (Oral)  Resp 18  SpO2 100%  Physical Exam  Nursing note and  vitals reviewed. Constitutional: She is oriented to person, place, and time. She appears well-developed and well-nourished. No distress.  HENT:  Head: Normocephalic.  Cardiovascular: Normal rate and regular rhythm.   Pulmonary/Chest: Effort normal and breath sounds normal. No respiratory distress. She has no wheezes. She has no rales.  Abdominal: Soft. There is tenderness in the right upper quadrant and epigastric area. There is no rigidity, no rebound, no guarding, no CVA tenderness and negative Murphy's sign.  Musculoskeletal: Normal range of motion.  Neurological: She is alert and oriented to person, place, and time.  Skin: Skin is warm and dry. No rash noted.  Psychiatric: She has a normal mood and affect. Her behavior is normal.    ED Course  Procedures   Results for orders placed during the hospital encounter of 11/13/12  URINALYSIS, ROUTINE W REFLEX MICROSCOPIC      Result Value Range   Color, Urine YELLOW  YELLOW   APPearance CLEAR  CLEAR   Specific Gravity, Urine 1.003 (*) 1.005 - 1.030   pH 7.5  5.0 - 8.0   Glucose, UA NEGATIVE  NEGATIVE mg/dL   Hgb urine dipstick NEGATIVE  NEGATIVE   Bilirubin Urine NEGATIVE  NEGATIVE   Ketones, ur NEGATIVE  NEGATIVE mg/dL   Protein, ur NEGATIVE  NEGATIVE mg/dL   Urobilinogen, UA 0.2  0.0 - 1.0 mg/dL   Nitrite NEGATIVE  NEGATIVE   Leukocytes, UA MODERATE (*) NEGATIVE  CBC      Result Value Range   WBC 3.5 (*) 4.0 - 10.5 K/uL   RBC 4.89  3.87 - 5.11 MIL/uL   Hemoglobin 14.9  12.0 - 15.0 g/dL   HCT 78.2  95.6 - 21.3 %   MCV 85.7  78.0 - 100.0 fL   MCH 30.5  26.0 - 34.0 pg   MCHC 35.6  30.0 - 36.0 g/dL   RDW 08.6  57.8 - 46.9 %   Platelets 220  150 - 400 K/uL  COMPREHENSIVE METABOLIC PANEL      Result Value Range   Sodium 140  135 - 145 mEq/L   Potassium 3.4 (*) 3.5 - 5.1 mEq/L   Chloride 99  96 - 112 mEq/L   CO2 29  19 - 32 mEq/L   Glucose, Bld 124 (*) 70 - 99 mg/dL  BUN 8  6 - 23 mg/dL   Creatinine, Ser 1.61  0.50 - 1.10  mg/dL   Calcium 9.6  8.4 - 09.6 mg/dL   Total Protein 7.4  6.0 - 8.3 g/dL   Albumin 3.9  3.5 - 5.2 g/dL   AST 045 (*) 0 - 37 U/L   ALT 477 (*) 0 - 35 U/L   Alkaline Phosphatase 142 (*) 39 - 117 U/L   Total Bilirubin 2.7 (*) 0.3 - 1.2 mg/dL   GFR calc non Af Amer 87 (*) >90 mL/min   GFR calc Af Amer >90  >90 mL/min  URINE MICROSCOPIC-ADD ON      Result Value Range   Squamous Epithelial / LPF RARE  RARE   WBC, UA 3-6  <3 WBC/hpf   Bacteria, UA RARE  RARE  LIPASE, BLOOD      Result Value Range   Lipase 81 (*) 11 - 59 U/L       US Abdomen Complete  11/13/2012  *RADIOLOGY REPORT*  Clinical Data:  Abdominal pain.  Rule out gallstones.  Vomiting. Recent kidney infection.  COMPLETE ABDOMINAL ULTRASOUND  Comparison:  CT of the abdomen and pelvis 04/30/2007  Findings:  Gallbladder:  Within the gallbladder, multiple small mobile stones are identified, measuring 6 mm in diameter and smaller. Gallbladder wall is upper limits normal in thickness, 3.6 mm.  No pericholecystic fluid identified.  No sonographic Murphy's sign.  Common bile duct:  Dilated, 11.3 mm.  Distal duct is not well seen.  Liver:  Echogenic without focal mass.  IVC:  Appears normal.  Pancreas:  Pancreatic duct is mildly prominent at 2.3 mm.  Spleen:  5.6 cm, normal in appearance.  Right Kidney:  10.8 cm, normal in appearance.  Left Kidney:  9.9 cm.  Simple cyst is 2.5 x 2.4 x 2.4 cm in mid pole region.  Abdominal aorta:  Not aneurysmal, 2.7 cm maximum diameter.  IMPRESSION:  1.  Multiple mobile gallstones. 2.  Dilated common bile duct raises the question of distal duct stones or other obstruction.  Distal duct is not well seen however.Consider further evaluation with CT of the abdomen with contrast. 3.  Left renal cyst.   Original Report Authenticated By: Norva Pavlov, M.D.      1. Pancreatitis due to common bile duct stone   2. Nausea and vomiting   3. Abdominal pain, right upper quadrant   4. Glaucoma   5.  Hypercholesterolemia   6. Breast cancer, unspecified laterality   7. Arthritis       MDM  Patient seen and evaluated. Patient sitting comfortably at this time does not appear in any acute distress.  Labs show elevated LFTs and lipase level. Patient is tender in the epigastric and right upper quadrant. Ultrasound ordered to evaluate for obstructing gallstone.  Patient discussed with attending physician. Will plan to admit to hospitalist.  Spoke with [facility]. They will see patient and admit      Angus Seller, PA-C 11/14/12 [id number]

## 2012-11-14 DIAGNOSIS — R112 Nausea with vomiting, unspecified: Secondary | ICD-10-CM | POA: Diagnosis present

## 2012-11-14 DIAGNOSIS — H409 Unspecified glaucoma: Secondary | ICD-10-CM

## 2012-11-14 DIAGNOSIS — M129 Arthropathy, unspecified: Secondary | ICD-10-CM

## 2012-11-14 DIAGNOSIS — K8010 Calculus of gallbladder with chronic cholecystitis without obstruction: Secondary | ICD-10-CM

## 2012-11-14 DIAGNOSIS — E7800 Pure hypercholesterolemia, unspecified: Secondary | ICD-10-CM

## 2012-11-14 DIAGNOSIS — R1011 Right upper quadrant pain: Secondary | ICD-10-CM | POA: Diagnosis present

## 2012-11-14 LAB — CBC
HCT: 38.3 % (ref 36.0–46.0)
Hemoglobin: 13 g/dL (ref 12.0–15.0)
MCH: 30.2 pg (ref 26.0–34.0)
MCHC: 33.9 g/dL (ref 30.0–36.0)
RDW: 13.5 % (ref 11.5–15.5)

## 2012-11-14 LAB — BASIC METABOLIC PANEL
BUN: 7 mg/dL (ref 6–23)
Chloride: 108 mEq/L (ref 96–112)
Creatinine, Ser: 0.77 mg/dL (ref 0.50–1.10)
GFR calc Af Amer: >90 mL/min (ref >90)
GFR calc non Af Amer: 83 mL/min — ABNORMAL LOW (ref >90)
Glucose, Bld: 112 mg/dL — ABNORMAL HIGH (ref 70–99)
Potassium: 3.7 mEq/L (ref 3.5–5.1)

## 2012-11-14 MED ORDER — TIMOLOL MALEATE 0.5 % OP SOLN
1.0000 [drp] | Freq: Every evening | OPHTHALMIC | Status: DC
  Filled 2012-11-14: qty 5

## 2012-11-14 MED ORDER — TIMOLOL MALEATE 0.5 % OP SOLN
1.0000 [drp] | Freq: Every day | OPHTHALMIC | Status: DC
  Administered 2012-11-14 – 2012-11-19 (×7): 1 [drp] via OPHTHALMIC
  Filled 2012-11-14: qty 5

## 2012-11-14 MED ORDER — ACETAMINOPHEN 650 MG RE SUPP: 650.0000 mg | Freq: Four times a day (QID) | RECTAL | Status: DC | PRN

## 2012-11-14 MED ORDER — ONDANSETRON HCL 4 MG PO TABS: 4.0000 mg | ORAL_TABLET | Freq: Four times a day (QID) | ORAL | Status: DC | PRN

## 2012-11-14 MED ORDER — ONDANSETRON HCL 4 MG/2ML IJ SOLN: 4.0000 mg | Freq: Four times a day (QID) | INTRAMUSCULAR | Status: DC | PRN

## 2012-11-14 MED ORDER — SODIUM CHLORIDE 0.9 % IV SOLN
INTRAVENOUS | Status: DC
  Administered 2012-11-14: 75 mL/h via INTRAVENOUS

## 2012-11-14 MED ORDER — ACETAMINOPHEN 325 MG PO TABS
650.0000 mg | ORAL_TABLET | Freq: Four times a day (QID) | ORAL | Status: DC | PRN
  Administered 2012-11-14: 650 mg via ORAL
  Filled 2012-11-14: qty 2

## 2012-11-14 MED ORDER — LOTEPREDNOL ETABONATE 0.5 % OP SUSP
2.0000 [drp] | Freq: Two times a day (BID) | OPHTHALMIC | Status: DC
  Administered 2012-11-14 – 2012-11-20 (×8): 2 [drp] via OPHTHALMIC
  Filled 2012-11-14: qty 5

## 2012-11-14 MED ORDER — HYDROMORPHONE HCL PF 1 MG/ML IJ SOLN: 0.5000 mg | INTRAMUSCULAR | Status: DC | PRN

## 2012-11-14 MED ORDER — PANTOPRAZOLE SODIUM 40 MG IV SOLR
40.0000 mg | Freq: Two times a day (BID) | INTRAVENOUS | Status: DC
  Administered 2012-11-14 – 2012-11-19 (×12): 40 mg via INTRAVENOUS
  Filled 2012-11-14 (×13): qty 40

## 2012-11-14 MED ORDER — ANASTROZOLE 1 MG PO TABS
1.0000 mg | ORAL_TABLET | Freq: Every day | ORAL | Status: DC
  Administered 2012-11-14 – 2012-11-20 (×7): 1 mg via ORAL
  Filled 2012-11-14 (×7): qty 1

## 2012-11-14 NOTE — ED Provider Notes (Signed)
Medical screening examination/treatment/procedure(s) were performed by non-physician practitioner and as supervising physician I was immediately available for consultation/collaboration.  Toy Baker, MD 11/14/12 [phone]

## 2012-11-14 NOTE — Progress Notes (Signed)
Patient seen and examined. Admitted earlier today for RUQ abdominal pain. Was found to have cholelithiases, transaminitis and dilated CBD, indicating choledocolithiasis as well as mild gallstone pancreatitis. Have consulted GI, Dr. Loreta Ave, for consideration of an ERCP. Abdominal pain and nausea have improved this am. Will continue to follow.  Peggye Pitt, MD [facility] Pager: [phone]

## 2012-11-14 NOTE — H&P (Signed)
[facility] History and Physical  Trinady ELIETTE DRUMWRIGHT ZOX:[id number] DOB: 1940-11-12 DOA: 11/13/2012  Referring physician: EDP   PCP: Ron Parker, MD  Specialists:   Chief Complaint: RUQ ABD Pain  HPI: Angela [name] is a 72 y.o. female who presents to the ED with complaints of nausea and vomiting for the past 2 weeks worsening over the past 24 hours, with increased RUQ ABD pain   10/10 at the worse and now a 7/10.   She denies having fever, diarrhea or constipation or hematemesis.  She was evaluated in the ED and found to have elevated LFTs, and Lipase levels and an Ultrasound of the ABD was performed which revealed Multiple Gallstones.   She was referred for admission.     Review of Systems: The patient denies anorexia, fever, chills, weight loss, vision loss, decreased hearing, hoarseness, chest pain, syncope, dyspnea on exertion, peripheral edema, balance deficits, hemoptysis, abdominal pain, melena, hematochezia, severe indigestion/heartburn, hematuria, incontinence, muscle weakness, suspicious skin lesions, transient blindness, difficulty walking, depression, unusual weight change, abnormal bleeding, enlarged lymph nodes, angioedema, and breast masses.    Past Medical History  Diagnosis Date  . Arthritis   . Blindness     90%  . Glaucoma(365)   . GERD (gastroesophageal reflux disease)   . Hyperlipidemia   . Irritable bowel syndrome (IBS)   . Mitral valve prolapse   . Herpes simplex type II infection 1999  . Myasthenia gravis   . Breast cancer 03/19/2007    right   Past Surgical History  Procedure Laterality Date  . Tonsillectomy  72 YEARS OLD  . Varicose vein surgery  72S  . Eye surgery  1993  . Knee surgery  2011  . Total abdominal hysterectomy w/ bilateral salpingoophorectomy    . Breast lumpectomy  04/06/2007    right  . Axillary node dissection  05/06/2007    right    Medications:  HOME MEDS: Prior to Admission medications   Medication Sig Start Date  End Date Taking? Authorizing Provider  anastrozole (ARIMIDEX) 1 MG tablet Take 1 mg by mouth daily.   Yes Historical Provider, MD  Calcium Carbonate-Vitamin D (CALCIUM + D PO) Take 1 tablet by mouth 2 (two) times a week. Days of the week varies - holds for loose stools   Yes Historical Provider, MD  fish oil-omega-3 fatty acids 1000 MG capsule Take 1 capsule by mouth daily. Fish oil 1200 mg + Vit D 1000  Holds for loose stools 04/03/08  Yes Historical Provider, MD  LOTEMAX 0.5 % ophthalmic suspension Place 2 drops into the right eye 2 (two) times daily.  03/29/11  Yes Historical Provider, MD  Multiple Vitamin (MULTIVITAMIN WITH MINERALS) TABS Take 1 tablet by mouth daily.   Yes Historical Provider, MD  omeprazole (PRILOSEC) 20 MG capsule Take 20 mg by mouth daily.  03/07/11  Yes Historical Provider, MD  timolol (TIMOPTIC) 0.5 % ophthalmic solution Place 1 drop into the right eye every evening.   Yes Historical Provider, MD  traMADol (ULTRAM) 50 MG tablet Take 50 mg by mouth daily as needed for pain.    Yes Historical Provider, MD    Allergies:  Allergies  Allergen Reactions  . Pravachol Rash    Social History:   reports that she has never smoked. She has never used smokeless tobacco. She reports that she does not drink alcohol or use illicit drugs.   Family History: Family History  Problem Relation Age of Onset  . Cancer Mother   .  Hypertension Sister     Physical Exam:  GEN:  Pleasant 72 year old Obese well developed African [place] Female examined  and in  discomfort but no acute distress; cooperative with exam Filed Vitals:   11/13/12 1711 11/13/12 2315 11/13/12 2345  BP: 114/72 128/74 118/77  Pulse: 93 71 66  Temp: 98.8 F (37.1 C)    TempSrc: Oral    Resp: 18    SpO2: 100% 97% 97%   Blood pressure 118/77, pulse 66, temperature 98.8 F (37.1 C), temperature source Oral, resp. rate 18, SpO2 97.00%. PSYCH: She is alert and oriented x4; does not appear anxious does not appear  depressed; affect is normal HEENT: Normocephalic and Atraumatic, Mucous membranes pink; PERRLA; EOM intact; Fundi:  Benign;  No scleral icterus, Nares: Patent, Oropharynx: Clear, Fair Dentition, Neck:  FROM, no cervical lymphadenopathy nor thyromegaly or carotid bruit; no JVD; Breasts:: Not examined CHEST WALL: No tenderness CHEST: Normal respiration, clear to auscultation bilaterally HEART: Regular rate and rhythm; no murmurs rubs or gallops BACK: No kyphosis or scoliosis; no CVA tenderness ABDOMEN: Positive Bowel Sounds, Obese, soft  Mildly tender in RUQ, +Murphy's sign, No HSM.   No Rebound No guarding.       Rectal Exam: Not done EXTREMITIES: No cyanosis, clubbing or edema; no ulcerations. Genitalia: not examined PULSES: 2+ and symmetric SKIN: Normal hydration no rash or ulceration CNS: Cranial nerves 2-12 grossly intact no focal neurologic deficit    Labs on Admission:  Basic Metabolic Panel:  Recent Labs Lab 11/13/12 1714  NA 140  K 3.4*  CL 99  CO2 29  GLUCOSE 124*  BUN 8  CREATININE 0.67  CALCIUM 9.6   Liver Function Tests:  Recent Labs Lab 11/13/12 1714  AST 670*  ALT 477*  ALKPHOS 142*  BILITOT 2.7*  PROT 7.4  ALBUMIN 3.9    Recent Labs Lab 11/13/12 1747  LIPASE 81*   No results found for this basename: AMMONIA,  in the last 168 hours CBC:  Recent Labs Lab 11/13/12 1714  WBC 3.5*  HGB 14.9  HCT 41.9  MCV 85.7  PLT 220   Cardiac Enzymes: No results found for this basename: CKTOTAL, CKMB, CKMBINDEX, TROPONINI,  in the last 168 hours  BNP (last 3 results) No results found for this basename: PROBNP,  in the last 8760 hours CBG: No results found for this basename: GLUCAP,  in the last 168 hours  Radiological Exams on Admission: US Abdomen Complete  11/13/2012  *RADIOLOGY REPORT*  Clinical Data:  Abdominal pain.  Rule out gallstones.  Vomiting. Recent kidney infection.  COMPLETE ABDOMINAL ULTRASOUND  Comparison:  CT of the abdomen and pelvis  04/30/2007  Findings:  Gallbladder:  Within the gallbladder, multiple small mobile stones are identified, measuring 6 mm in diameter and smaller. Gallbladder wall is upper limits normal in thickness, 3.6 mm.  No pericholecystic fluid identified.  No sonographic Murphy's sign.  Common bile duct:  Dilated, 11.3 mm.  Distal duct is not well seen.  Liver:  Echogenic without focal mass.  IVC:  Appears normal.  Pancreas:  Pancreatic duct is mildly prominent at 2.3 mm.  Spleen:  5.6 cm, normal in appearance.  Right Kidney:  10.8 cm, normal in appearance.  Left Kidney:  9.9 cm.  Simple cyst is 2.5 x 2.4 x 2.4 cm in mid pole region.  Abdominal aorta:  Not aneurysmal, 2.7 cm maximum diameter.  IMPRESSION:  1.  Multiple mobile gallstones. 2.  Dilated common bile duct raises  the question of distal duct stones or other obstruction.  Distal duct is not well seen however.Consider further evaluation with CT of the abdomen with contrast. 3.  Left renal cyst.   Original Report Authenticated By: Norva Pavlov, M.D.       Assessment/Plan Principal Problem:   Pancreatitis due to common bile duct stone Active Problems:   Breast cancer   Arthritis   Glaucoma   Hypercholesterolemia   Nausea and vomiting   Abdominal pain, right upper quadrant   1.   Acute Gallstone Pancreatitis-  Lipase =81,   Supportive therapy with pain Control Prn, Anti-Emetics PRN, and IVFs.   GI consult in Am sooner PRN for possible ERCP or MRCP.     2.   ABD Pain-   Due to #1.     3.  Nausea and Vomiting-  Due to #1,  Anit-emetics PRN.     4.   Breast Cancer-   In Remission,  On Arimedex.  Has Appt with Oncology for F/U soon.     5.   Hyperlipidemia- on Omega 3 fatty acids,  Had intolerance to statins.     6.   Arthritis- Stable.      7.   Glaucoma-  Continue Timoptic, and Lotemax Ophthalmic drops.     8.   SCDs for DVT prophylaxis.      Code Status:    FULL CODE Family Communication:   No Family Present Disposition Plan:   Return  to Home   Time spent: 45 Minutes   Ron Parker [facility] Pager [phone]  If 7PM-7AM, please contact night-coverage [email or web address] Password [email or web address] 11/14/2012, 1:45 AM

## 2012-11-14 NOTE — Consult Note (Signed)
Reason for Consult:gallstone pancreatitis Referring Physician: Dr.J. Loreta Ave  Angela [name] is an 72 y.o. female.  HPI: the patient is a 72 year old female with a two-week history of epigastric right upper quadrant pain. Patient also she's had some nausea.  she states she has pain that is recurrent after eating, mainly high fatty foods.  Patient underwent workupon admission which included an ultrasound and a dilated common bile duct of 11.3 mm. The patient was seen by GI, Dr. Loreta Ave, who is to perform an ERCP on Monday.the patient currently has no signs of being toxic. However her T. Bili is elevated at 2.7.  Patient's previous patient of Dr. Jamey Ripa who in 2009 removed a breast cancer.    Past Medical History  Diagnosis Date  . Arthritis   . Blindness     90%  . Glaucoma(365)   . GERD (gastroesophageal reflux disease)   . Hyperlipidemia   . Irritable bowel syndrome (IBS)   . Mitral valve prolapse   . Herpes simplex type II infection 1999  . Myasthenia gravis   . Breast cancer 03/19/2007    right    Past Surgical History  Procedure Laterality Date  . Tonsillectomy  72 YEARS OLD  . Varicose vein surgery  72[facility]  . Eye surgery  1993  . Knee surgery  2011  . Total abdominal hysterectomy w/ bilateral salpingoophorectomy    . Breast lumpectomy  04/06/2007    right  . Axillary node dissection  05/06/2007    right    Family History  Problem Relation Age of Onset  . Cancer Mother   . Hypertension Sister     Social History:  reports that she has never smoked. She has never used smokeless tobacco. She reports that she does not drink alcohol or use illicit drugs.  Allergies:  Allergies  Allergen Reactions  . Pravachol Rash    Medications: I have reviewed the patient's current medications.  Results for orders placed during the hospital encounter of 11/13/12 (from the past 48 hour(s))  CBC     Status: Abnormal   Collection Time    11/13/12  5:14 PM      Result Value Range   WBC  3.5 (*) 4.0 - 10.5 K/uL   RBC 4.89  3.87 - 5.11 MIL/uL   Hemoglobin 14.9  12.0 - 15.0 g/dL   HCT 16.1  09.6 - 04.5 %   MCV 85.7  78.0 - 100.0 fL   MCH 30.5  26.0 - 34.0 pg   MCHC 35.6  30.0 - 36.0 g/dL   RDW 40.9  81.1 - 91.4 %   Platelets 220  150 - 400 K/uL  COMPREHENSIVE METABOLIC PANEL     Status: Abnormal   Collection Time    11/13/12  5:14 PM      Result Value Range   Sodium 140  135 - 145 mEq/L   Potassium 3.4 (*) 3.5 - 5.1 mEq/L   Chloride 99  96 - 112 mEq/L   CO2 29  19 - 32 mEq/L   Glucose, Bld 124 (*) 70 - 99 mg/dL   BUN 8  6 - 23 mg/dL   Creatinine, Ser 7.82  0.50 - 1.10 mg/dL   Calcium 9.6  8.4 - 95.6 mg/dL   Total Protein 7.4  6.0 - 8.3 g/dL   Albumin 3.9  3.5 - 5.2 g/dL   AST 213 (*) 0 - 37 U/L   ALT 477 (*) 0 - 35 U/L   Alkaline Phosphatase  142 (*) 39 - 117 U/L   Total Bilirubin 2.7 (*) 0.3 - 1.2 mg/dL   GFR calc non Af Amer 87 (*) >90 mL/min   GFR calc Af Amer >90  >90 mL/min   Comment:            The eGFR has been calculated     using the CKD EPI equation.     This calculation has not been     validated in all clinical     situations.     eGFR's persistently     <90 mL/min signify     possible Chronic Kidney Disease.  URINALYSIS, ROUTINE W REFLEX MICROSCOPIC     Status: Abnormal   Collection Time    11/13/12  5:28 PM      Result Value Range   Color, Urine YELLOW  YELLOW   APPearance CLEAR  CLEAR   Specific Gravity, Urine 1.003 (*) 1.005 - 1.030   pH 7.5  5.0 - 8.0   Glucose, UA NEGATIVE  NEGATIVE mg/dL   Hgb urine dipstick NEGATIVE  NEGATIVE   Bilirubin Urine NEGATIVE  NEGATIVE   Ketones, ur NEGATIVE  NEGATIVE mg/dL   Protein, ur NEGATIVE  NEGATIVE mg/dL   Urobilinogen, UA 0.2  0.0 - 1.0 mg/dL   Nitrite NEGATIVE  NEGATIVE   Leukocytes, UA MODERATE (*) NEGATIVE  URINE MICROSCOPIC-ADD ON     Status: None   Collection Time    11/13/12  5:28 PM      Result Value Range   Squamous Epithelial / LPF RARE  RARE   WBC, UA 3-6  <3 WBC/hpf   Bacteria,  UA RARE  RARE  LIPASE, BLOOD     Status: Abnormal   Collection Time    11/13/12  5:47 PM      Result Value Range   Lipase 81 (*) 11 - 59 U/L  BASIC METABOLIC PANEL     Status: Abnormal   Collection Time    11/14/12  6:24 AM      Result Value Range   Sodium 144  135 - 145 mEq/L   Potassium 3.7  3.5 - 5.1 mEq/L   Chloride 108  96 - 112 mEq/L   CO2 27  19 - 32 mEq/L   Glucose, Bld 112 (*) 70 - 99 mg/dL   BUN 7  6 - 23 mg/dL   Creatinine, Ser 6.21  0.50 - 1.10 mg/dL   Calcium 8.8  8.4 - 30.8 mg/dL   GFR calc non Af Amer 83 (*) >90 mL/min   GFR calc Af Amer >90  >90 mL/min   Comment:            The eGFR has been calculated     using the CKD EPI equation.     This calculation has not been     validated in all clinical     situations.     eGFR's persistently     <90 mL/min signify     possible Chronic Kidney Disease.  CBC     Status: Abnormal   Collection Time    11/14/12  6:24 AM      Result Value Range   WBC 2.4 (*) 4.0 - 10.5 K/uL   RBC 4.30  3.87 - 5.11 MIL/uL   Hemoglobin 13.0  12.0 - 15.0 g/dL   HCT 65.7  84.6 - 96.2 %   MCV 89.1  78.0 - 100.0 fL   MCH 30.2  26.0 - 34.0 pg  MCHC 33.9  30.0 - 36.0 g/dL   RDW 19.1  47.8 - 29.5 %   Platelets 183  150 - 400 K/uL    US Abdomen Complete  11/13/2012  *RADIOLOGY REPORT*  Clinical Data:  Abdominal pain.  Rule out gallstones.  Vomiting. Recent kidney infection.  COMPLETE ABDOMINAL ULTRASOUND  Comparison:  CT of the abdomen and pelvis 04/30/2007  Findings:  Gallbladder:  Within the gallbladder, multiple small mobile stones are identified, measuring 6 mm in diameter and smaller. Gallbladder wall is upper limits normal in thickness, 3.6 mm.  No pericholecystic fluid identified.  No sonographic Murphy's sign.  Common bile duct:  Dilated, 11.3 mm.  Distal duct is not well seen.  Liver:  Echogenic without focal mass.  IVC:  Appears normal.  Pancreas:  Pancreatic duct is mildly prominent at 2.3 mm.  Spleen:  5.6 cm, normal in appearance.   Right Kidney:  10.8 cm, normal in appearance.  Left Kidney:  9.9 cm.  Simple cyst is 2.5 x 2.4 x 2.4 cm in mid pole region.  Abdominal aorta:  Not aneurysmal, 2.7 cm maximum diameter.  IMPRESSION:  1.  Multiple mobile gallstones. 2.  Dilated common bile duct raises the question of distal duct stones or other obstruction.  Distal duct is not well seen however.Consider further evaluation with CT of the abdomen with contrast. 3.  Left renal cyst.   Original Report Authenticated By: Norva Pavlov, M.D.     Review of Systems  Constitutional: Negative.   HENT: Negative.   Eyes: Negative.   Respiratory: Negative.   Cardiovascular: Negative.   Gastrointestinal: Positive for nausea and abdominal pain. Negative for vomiting, diarrhea and constipation.  Genitourinary: Negative.   Musculoskeletal: Negative.   Neurological: Negative.    Blood pressure 98/62, pulse 74, temperature 97.9 F (36.6 C), temperature source Oral, resp. rate 18, SpO2 95.00%. Physical Exam  Constitutional: She is oriented to person, place, and time. She appears well-developed and well-nourished.  HENT:  Head: Normocephalic and atraumatic.  Eyes: Conjunctivae and EOM are normal. Pupils are equal, round, and reactive to light.  Neck: Normal range of motion. Neck supple.  Cardiovascular: Normal rate, regular rhythm and normal heart sounds.   Respiratory: Effort normal.  GI: Bowel sounds are normal. She exhibits no mass. There is tenderness (epigastrum). There is no rebound and no guarding.  Musculoskeletal: Normal range of motion.  Neurological: She is alert and oriented to person, place, and time.    Assessment/Plan: 72 year old female with gallstone pancreatitis.  1. Will await ERCP for clearance of common bile duct to be followed by a laparoscopic cholecystectomy.  2. We'll continue to follow patient.  Angela Ehlers., Angela [name] 11/14/2012, 1:48 PM

## 2012-11-14 NOTE — Consult Note (Signed)
Cross cover for [facility]-Dr. Melvia Heaps. Reason for Consult: Gallstone pancreatitis. Referring Physician: THP  Angela [name] is an 72 y.o. female.  HPI: 72 year old black female, with a 2 week history of RUQ and epigastric pain, nausea worse post-prandially, worse with greasy foods, developed worsening of her symptoms 2 days ago with vomiting and severe abdominal pain [10/10]. Found to have multiple gallstones on an abdominal ultrasound done on admission; CBD diameter is 11.3 cm. Lipase mildly elevated at 81. Currently pain free. Reportedly had a colonoscopy in 2012 by Dr. Melvia Heaps when she had "precancerous" colonic polyps removed and was found to have diverticulosis. She has also had an esophageal stricture dilated in 2001 by Dr. Russella Dar. She denies any dysphagia or odynophagia at this time. She has a good appetite and her weight has been stable.    Past Medical History  Diagnosis Date  . Arthritis   . Blindness     90%  . Glaucoma(365)   . GERD (gastroesophageal reflux disease)/hiatal hernia/esophagela stricture   . Hyperlipidemia   . Irritable bowel syndrome (IBS)   . Mitral valve prolapse   . Herpes simplex type II infection 1999  . Myasthenia gravis   . Breast cancer 03/19/2007    right      Diverticulosis     Colonic polyps Past Surgical History  Procedure Laterality Date  . Tonsillectomy  72 YEARS OLD  . Varicose vein surgery  72[facility]  . Eye surgery  1993  . Knee surgery-arthoscopy  2011  . Total abdominal hysterectomy w/ bilateral salpingoophorectomy for fibroids    . Breast lumpectomy  04/06/2007    right  . Axillary node dissection  05/06/2007    right   Family History  Problem Relation Age of Onset  . Cancer Mother   . Hypertension Sister    Social History:  reports that she has never smoked. She has never used smokeless tobacco. She reports that she does not drink alcohol or use illicit drugs.  Allergies:  Allergies  Allergen Reactions  . Pravachol Rash    Medications: I have reviewed the patient's current medications.  Results for orders placed during the hospital encounter of 11/13/12 (from the past 48 hour(s))  CBC     Status: Abnormal   Collection Time    11/13/12  5:14 PM      Result Value Range   WBC 3.5 (*) 4.0 - 10.5 K/uL   RBC 4.89  3.87 - 5.11 MIL/uL   Hemoglobin 14.9  12.0 - 15.0 g/dL   HCT 16.1  09.6 - 04.5 %   MCV 85.7  78.0 - 100.0 fL   MCH 30.5  26.0 - 34.0 pg   MCHC 35.6  30.0 - 36.0 g/dL   RDW 40.9  81.1 - 91.4 %   Platelets 220  150 - 400 K/uL  COMPREHENSIVE METABOLIC PANEL     Status: Abnormal   Collection Time    11/13/12  5:14 PM      Result Value Range   Sodium 140  135 - 145 mEq/L   Potassium 3.4 (*) 3.5 - 5.1 mEq/L   Chloride 99  96 - 112 mEq/L   CO2 29  19 - 32 mEq/L   Glucose, Bld 124 (*) 70 - 99 mg/dL   BUN 8  6 - 23 mg/dL   Creatinine, Ser 7.82  0.50 - 1.10 mg/dL   Calcium 9.6  8.4 - 95.6 mg/dL   Total Protein 7.4  6.0 -  8.3 g/dL   Albumin 3.9  3.5 - 5.2 g/dL   AST 161 (*) 0 - 37 U/L   ALT 477 (*) 0 - 35 U/L   Alkaline Phosphatase 142 (*) 39 - 117 U/L   Total Bilirubin 2.7 (*) 0.3 - 1.2 mg/dL   GFR calc non Af Amer 87 (*) >90 mL/min   GFR calc Af Amer >90  >90 mL/min   Comment:            The eGFR has been calculated     using the CKD EPI equation.     This calculation has not been     validated in all clinical     situations.     eGFR's persistently     <90 mL/min signify     possible Chronic Kidney Disease.  URINALYSIS, ROUTINE W REFLEX MICROSCOPIC     Status: Abnormal   Collection Time    11/13/12  5:28 PM      Result Value Range   Color, Urine YELLOW  YELLOW   APPearance CLEAR  CLEAR   Specific Gravity, Urine 1.003 (*) 1.005 - 1.030   pH 7.5  5.0 - 8.0   Glucose, UA NEGATIVE  NEGATIVE mg/dL   Hgb urine dipstick NEGATIVE  NEGATIVE   Bilirubin Urine NEGATIVE  NEGATIVE   Ketones, ur NEGATIVE  NEGATIVE mg/dL   Protein, ur NEGATIVE  NEGATIVE mg/dL   Urobilinogen, UA 0.2  0.0 - 1.0  mg/dL   Nitrite NEGATIVE  NEGATIVE   Leukocytes, UA MODERATE (*) NEGATIVE  URINE MICROSCOPIC-ADD ON     Status: None   Collection Time    11/13/12  5:28 PM      Result Value Range   Squamous Epithelial / LPF RARE  RARE   WBC, UA 3-6  <3 WBC/hpf   Bacteria, UA RARE  RARE  LIPASE, BLOOD     Status: Abnormal   Collection Time    11/13/12  5:47 PM      Result Value Range   Lipase 81 (*) 11 - 59 U/L  BASIC METABOLIC PANEL     Status: Abnormal   Collection Time    11/14/12  6:24 AM      Result Value Range   Sodium 144  135 - 145 mEq/L   Potassium 3.7  3.5 - 5.1 mEq/L   Chloride 108  96 - 112 mEq/L   CO2 27  19 - 32 mEq/L   Glucose, Bld 112 (*) 70 - 99 mg/dL   BUN 7  6 - 23 mg/dL   Creatinine, Ser 0.96  0.50 - 1.10 mg/dL   Calcium 8.8  8.4 - 04.5 mg/dL   GFR calc non Af Amer 83 (*) >90 mL/min   GFR calc Af Amer >90  >90 mL/min   Comment:            The eGFR has been calculated     using the CKD EPI equation.     This calculation has not been     validated in all clinical     situations.     eGFR's persistently     <90 mL/min signify     possible Chronic Kidney Disease.  CBC     Status: Abnormal   Collection Time    11/14/12  6:24 AM      Result Value Range   WBC 2.4 (*) 4.0 - 10.5 K/uL   RBC 4.30  3.87 - 5.11 MIL/uL   Hemoglobin 13.0  12.0 - 15.0 g/dL   HCT 16.1  09.6 - 04.5 %   MCV 89.1  78.0 - 100.0 fL   MCH 30.2  26.0 - 34.0 pg   MCHC 33.9  30.0 - 36.0 g/dL   RDW 40.9  81.1 - 91.4 %   Platelets 183  150 - 400 K/uL    US Abdomen Complete  11/13/2012  *RADIOLOGY REPORT*  Clinical Data:  Abdominal pain.  Rule out gallstones.  Vomiting. Recent kidney infection.  COMPLETE ABDOMINAL ULTRASOUND  Comparison:  CT of the abdomen and pelvis 04/30/2007  Findings:  Gallbladder:  Within the gallbladder, multiple small mobile stones are identified, measuring 6 mm in diameter and smaller. Gallbladder wall is upper limits normal in thickness, 3.6 mm.  No pericholecystic fluid  identified.  No sonographic Murphy's sign.  Common bile duct:  Dilated, 11.3 mm.  Distal duct is not well seen.  Liver:  Echogenic without focal mass.  IVC:  Appears normal.  Pancreas:  Pancreatic duct is mildly prominent at 2.3 mm.  Spleen:  5.6 cm, normal in appearance.  Right Kidney:  10.8 cm, normal in appearance.  Left Kidney:  9.9 cm.  Simple cyst is 2.5 x 2.4 x 2.4 cm in mid pole region.  Abdominal aorta:  Not aneurysmal, 2.7 cm maximum diameter.  IMPRESSION:  1.  Multiple mobile gallstones. 2.  Dilated common bile duct raises the question of distal duct stones or other obstruction.  Distal duct is not well seen however.Consider further evaluation with CT of the abdomen with contrast. 3.  Left renal cyst.   Original Report Authenticated By: Norva Pavlov, M.D.    Review of Systems  Constitutional: Negative.   HENT: Negative.   Eyes: Negative.   Respiratory: Negative.   Cardiovascular: Negative.   Gastrointestinal: Positive for nausea, vomiting and abdominal pain. Negative for heartburn, diarrhea and constipation.  Genitourinary: Negative.   Musculoskeletal: Positive for joint pain.  Skin: Negative.   Neurological: Negative.   Psychiatric/Behavioral: Negative.    Blood pressure 98/62, pulse 74, temperature 97.9 F (36.6 C), temperature source Oral, resp. rate 18, SpO2 95.00%. Physical Exam  Constitutional: She is oriented to person, place, and time. She appears well-developed and well-nourished.  HENT:  Head: Normocephalic and atraumatic.  Eyes: Conjunctivae and EOM are normal. Pupils are equal, round, and reactive to light.  Neck: Normal range of motion. Neck supple.  Cardiovascular: Normal rate, regular rhythm and normal heart sounds.   Respiratory: Effort normal.  GI: Soft. Bowel sounds are normal. She exhibits no distension and no mass. There is no tenderness. There is no rebound and no guarding.  Musculoskeletal: Normal range of motion.  Neurological: She is alert and oriented  to person, place, and time.  Skin: Skin is warm and dry.  Psychiatric: She has a normal mood and affect. Her behavior is normal. Judgment and thought content normal.   Assessment/Plan: 1) Cholelithiasis with gallstone pancreatitis-abnormal LFT's with Lipase of 81. Will plan to schedule the ERCP for Monday.  2) GERD/Hiatal hernia: On PPI's. 3) Diverticulosis.  4) Personal history of colonic polyps.  5) Personal history of breast cancer.  Angela [name] 11/14/2012, 10:47 AM

## 2012-11-15 DIAGNOSIS — R112 Nausea with vomiting, unspecified: Secondary | ICD-10-CM

## 2012-11-15 DIAGNOSIS — C50919 Malignant neoplasm of unspecified site of unspecified female breast: Secondary | ICD-10-CM

## 2012-11-15 LAB — HEPATIC FUNCTION PANEL
AST: 232 U/L — ABNORMAL HIGH (ref 0–37)
Albumin: 3.1 g/dL — ABNORMAL LOW (ref 3.5–5.2)
Alkaline Phosphatase: 193 U/L — ABNORMAL HIGH (ref 39–117)
Bilirubin, Direct: 4.7 mg/dL — ABNORMAL HIGH (ref 0.0–0.3)
Total Bilirubin: 5.7 mg/dL — ABNORMAL HIGH (ref 0.3–1.2)

## 2012-11-15 NOTE — Progress Notes (Signed)
Patient ID: Angela [name], female   DOB: 06-11-1941, 72 y.o.   MRN: [id number]  General Surgery - [facility] - Progress Note  HD# 2  Subjective: Patient up in chair.  Taking po clear liquids.  No nausea.  Minimal pain now.  Objective: Vital signs in last 24 hours: Temp:  [98.3 F (36.8 C)-98.6 F (37 C)] 98.3 F (36.8 C) (03/16 0627) Pulse Rate:  [64-77] 77 (03/16 0627) Resp:  [16-17] 16 (03/16 0627) BP: (116-126)/(67-80) 123/72 mmHg (03/16 0627) SpO2:  [95 %-100 %] 95 % (03/16 0627) Last BM Date: 11/12/12  Intake/Output from previous day:    Exam: HEENT - clear, not icteric Neck - soft Chest - clear bilaterally Cor - RRR, no murmur Abd - soft, obese, non-tender; no mass Ext - no significant edema Neuro - grossly intact, no focal deficits  Lab Results:   Recent Labs  11/13/12 1714 11/14/12 0624  WBC 3.5* 2.4*  HGB 14.9 13.0  HCT 41.9 38.3  PLT 220 183     Recent Labs  11/13/12 1714 11/14/12 0624  NA 140 144  K 3.4* 3.7  CL 99 108  CO2 29 27  GLUCOSE 124* 112*  BUN 8 7  CREATININE 0.67 0.77  CALCIUM 9.6 8.8    Studies/Results: US Abdomen Complete  11/13/2012  *RADIOLOGY REPORT*  Clinical Data:  Abdominal pain.  Rule out gallstones.  Vomiting. Recent kidney infection.  COMPLETE ABDOMINAL ULTRASOUND  Comparison:  CT of the abdomen and pelvis 04/30/2007  Findings:  Gallbladder:  Within the gallbladder, multiple small mobile stones are identified, measuring 6 mm in diameter and smaller. Gallbladder wall is upper limits normal in thickness, 3.6 mm.  No pericholecystic fluid identified.  No sonographic Murphy's sign.  Common bile duct:  Dilated, 11.3 mm.  Distal duct is not well seen.  Liver:  Echogenic without focal mass.  IVC:  Appears normal.  Pancreas:  Pancreatic duct is mildly prominent at 2.3 mm.  Spleen:  5.6 cm, normal in appearance.  Right Kidney:  10.8 cm, normal in appearance.  Left Kidney:  9.9 cm.  Simple cyst is 2.5 x 2.4 x  2.4 cm in mid pole region.  Abdominal aorta:  Not aneurysmal, 2.7 cm maximum diameter.  IMPRESSION:  1.  Multiple mobile gallstones. 2.  Dilated common bile duct raises the question of distal duct stones or other obstruction.  Distal duct is not well seen however.Consider further evaluation with CT of the abdomen with contrast. 3.  Left renal cyst.   Original Report Authenticated By: Norva Pavlov, M.D.     Assessment / Plan: 1.  Biliary pancreatits, rule out choledocholithiasis, cholelithiasis  ERCP planned for Monday, 3/17  Clear liquid diet today  Will follow - tentatively plan lap chole later this week pending results of ERCP  Velora Heckler, MD, [facility] Office: [phone]  11/15/2012

## 2012-11-15 NOTE — Progress Notes (Signed)
Subjective: Cross cover [facility] Since I last evaluated the patient, she seems to be doing somewhat better. She denies having any abdominal pain, nausea, vomiting, fever or chills. Awaiting a ERCP tomorrow.   Objective: Vital signs in last 24 hours: Temp:  [97.6 F (36.4 C)-98.6 F (37 C)] 97.6 F (36.4 C) (03/16 1409) Pulse Rate:  [64-77] 73 (03/16 1409) Resp:  [16-17] 16 (03/16 1409) BP: (116-123)/(67-86) 119/86 mmHg (03/16 1409) SpO2:  [95 %-100 %] 100 % (03/16 1409) Last BM Date: 11/12/12  Intake/Output from previous day: 03/15 0701 - 03/16 0700 In: 240 [P.O.:240] Out: -  Intake/Output this shift: Total I/O In: 480 [P.O.:480] Out: -   General appearance: alert, cooperative, appears stated age, no distress and moderately obese Resp: clear to auscultation bilaterally Cardio: regular rate and rhythm, S1, S2 normal, no murmur, click, rub or gallop GI: soft, non-tender; bowel sounds normal; no masses,  no organomegaly Extremities: extremities normal, atraumatic, no cyanosis or edema  Lab Results:  Recent Labs  11/13/12 1714 11/14/12 0624  WBC 3.5* 2.4*  HGB 14.9 13.0  HCT 41.9 38.3  PLT 220 183   BMET  Recent Labs  11/13/12 1714 11/14/12 0624  NA 140 144  K 3.4* 3.7  CL 99 108  CO2 29 27  GLUCOSE 124* 112*  BUN 8 7  CREATININE 0.67 0.77  CALCIUM 9.6 8.8   LFT  Recent Labs  11/15/12 0857  PROT 6.3  ALBUMIN 3.1*  AST 232*  ALT 382*  ALKPHOS 193*  BILITOT 5.7*  BILIDIR 4.7*  IBILI 1.0*    Studies/Results: US Abdomen Complete  11/13/2012  *RADIOLOGY REPORT*  Clinical Data:  Abdominal pain.  Rule out gallstones.  Vomiting. Recent kidney infection.  COMPLETE ABDOMINAL ULTRASOUND  Comparison:  CT of the abdomen and pelvis 04/30/2007  Findings:  Gallbladder:  Within the gallbladder, multiple small mobile stones are identified, measuring 6 mm in diameter and smaller. Gallbladder wall is upper limits normal in thickness, 3.6 mm.  No pericholecystic fluid  identified.  No sonographic Murphy's sign.  Common bile duct:  Dilated, 11.3 mm.  Distal duct is not well seen.  Liver:  Echogenic without focal mass.  IVC:  Appears normal.  Pancreas:  Pancreatic duct is mildly prominent at 2.3 mm.  Spleen:  5.6 cm, normal in appearance.  Right Kidney:  10.8 cm, normal in appearance.  Left Kidney:  9.9 cm.  Simple cyst is 2.5 x 2.4 x 2.4 cm in mid pole region.  Abdominal aorta:  Not aneurysmal, 2.7 cm maximum diameter.  IMPRESSION:  1.  Multiple mobile gallstones. 2.  Dilated common bile duct raises the question of distal duct stones or other obstruction.  Distal duct is not well seen however.Consider further evaluation with CT of the abdomen with contrast. 3.  Left renal cyst.   Original Report Authenticated By: Norva Pavlov, M.D.    Medications: I have reviewed the patient's current medications.  Assessment/Plan: 1) Gallstone pancreatitis with cholelithiasis and dialated ducts: ERCP planned for tomorrow.  LOS: 2 days   Merelyn Klump 11/15/2012, 3:16 PM

## 2012-11-15 NOTE — Progress Notes (Signed)
[facility]             Progress Note   Subjective: Sitting up in bed. Tolerating clear liquids. Minimal pain. No nausea.  Objective: Vital signs in last 24 hours: Temp:  [98.3 F (36.8 C)-98.6 F (37 C)] 98.3 F (36.8 C) (03/16 0627) Pulse Rate:  [64-77] 77 (03/16 0627) Resp:  [16-17] 16 (03/16 0627) BP: (116-126)/(67-80) 123/72 mmHg (03/16 0627) SpO2:  [95 %-100 %] 95 % (03/16 0627) Weight change:  Last BM Date: 11/12/12  Intake/Output from previous day:       Physical Exam: General: Alert, awake, oriented x3, in no acute distress. HEENT: No bruits, no goiter. Heart: Regular rate and rhythm, without murmurs, rubs, gallops. Lungs: Clear to auscultation bilaterally. Abdomen: Soft, nontender, nondistended, positive bowel sounds. Extremities: No clubbing cyanosis or edema with positive pedal pulses. Neuro: Grossly intact, nonfocal.    Lab Results: Basic Metabolic Panel:  Recent Labs  16/10/96 1714 11/14/12 0624  NA 140 144  K 3.4* 3.7  CL 99 108  CO2 29 27  GLUCOSE 124* 112*  BUN 8 7  CREATININE 0.67 0.77  CALCIUM 9.6 8.8   Liver Function Tests:  Recent Labs  11/13/12 1714 11/15/12 0857  AST 670* 232*  ALT 477* 382*  ALKPHOS 142* 193*  BILITOT 2.7* 5.7*  PROT 7.4 6.3  ALBUMIN 3.9 3.1*    Recent Labs  11/13/12 1747  LIPASE 81*   CBC:  Recent Labs  11/13/12 1714 11/14/12 0624  WBC 3.5* 2.4*  HGB 14.9 13.0  HCT 41.9 38.3  MCV 85.7 89.1  PLT 220 183   Urinalysis:  Recent Labs  11/13/12 1728  COLORURINE YELLOW  LABSPEC 1.003*  PHURINE 7.5  GLUCOSEU NEGATIVE  HGBUR NEGATIVE  BILIRUBINUR NEGATIVE  KETONESUR NEGATIVE  PROTEINUR NEGATIVE  UROBILINOGEN 0.2  NITRITE NEGATIVE  LEUKOCYTESUR MODERATE*    Recent Results (from the past 240 hour(s))  MRSA PCR SCREENING     Status: None   Collection Time    11/15/12  6:38 AM      Result Value Range Status   MRSA by PCR NEGATIVE  NEGATIVE Final   Comment:             The GeneXpert MRSA Assay (FDA     approved for NASAL specimens     only), is one component of a     comprehensive MRSA colonization     surveillance program. It is not     intended to diagnose MRSA     infection nor to guide or     monitor treatment for     MRSA infections.    Studies/Results: US Abdomen Complete  11/13/2012  *RADIOLOGY REPORT*  Clinical Data:  Abdominal pain.  Rule out gallstones.  Vomiting. Recent kidney infection.  COMPLETE ABDOMINAL ULTRASOUND  Comparison:  CT of the abdomen and pelvis 04/30/2007  Findings:  Gallbladder:  Within the gallbladder, multiple small mobile stones are identified, measuring 6 mm in diameter and smaller. Gallbladder wall is upper limits normal in thickness, 3.6 mm.  No pericholecystic fluid identified.  No sonographic Murphy's sign.  Common bile duct:  Dilated, 11.3 mm.  Distal duct is not well seen.  Liver:  Echogenic without focal mass.  IVC:  Appears normal.  Pancreas:  Pancreatic duct is mildly prominent at 2.3 mm.  Spleen:  5.6 cm, normal in appearance.  Right Kidney:  10.8 cm, normal in appearance.  Left Kidney:  9.9 cm.  Simple cyst is 2.5  x 2.4 x 2.4 cm in mid pole region.  Abdominal aorta:  Not aneurysmal, 2.7 cm maximum diameter.  IMPRESSION:  1.  Multiple mobile gallstones. 2.  Dilated common bile duct raises the question of distal duct stones or other obstruction.  Distal duct is not well seen however.Consider further evaluation with CT of the abdomen with contrast. 3.  Left renal cyst.   Original Report Authenticated By: Norva Pavlov, M.D.     Medications: Scheduled Meds: . anastrozole  1 mg Oral Daily  . loteprednol  2 drop Right Eye BID  . pantoprazole (PROTONIX) IV  40 mg Intravenous Q12H  . timolol  1 drop Right Eye QHS   Continuous Infusions: . sodium chloride 75 mL/hr (11/14/12 2303)   PRN Meds:.acetaminophen, acetaminophen, HYDROmorphone (DILAUDID) injection, ondansetron (ZOFRAN) IV,  ondansetron  Assessment/Plan:  Principal Problem:   Pancreatitis due to common bile duct stone Active Problems:   Breast cancer   Arthritis   Glaucoma   Hypercholesterolemia   Nausea and vomiting   Abdominal pain, right upper quadrant    Gallstone Pancreatitis -ERCP is planned for tomorrow. -Will likely need a cholecystectomy this admission as well. -Continue conservative management for now. -LFTs trending down.  Time spent coordinating care: 35 minutes.   LOS: 2 days   [facility] Pager: [phone] 11/15/2012, 12:05 PM

## 2012-11-15 NOTE — Progress Notes (Signed)
Utilization review completed.  

## 2012-11-16 ENCOUNTER — Inpatient Hospital Stay (HOSPITAL_COMMUNITY): Payer: Medicare Other

## 2012-11-16 ENCOUNTER — Encounter (HOSPITAL_COMMUNITY)
Admission: EM | Disposition: A | Discharge status: Home / Self Care | Payer: Self-pay | Source: Home / Self Care | Attending: Internal Medicine

## 2012-11-16 ENCOUNTER — Inpatient Hospital Stay (HOSPITAL_COMMUNITY): Payer: Medicare Other | Admitting: Anesthesiology

## 2012-11-16 ENCOUNTER — Encounter (HOSPITAL_COMMUNITY): Payer: Self-pay | Admitting: Anesthesiology

## 2012-11-16 DIAGNOSIS — R17 Unspecified jaundice: Secondary | ICD-10-CM | POA: Diagnosis not present

## 2012-11-16 DIAGNOSIS — K838 Other specified diseases of biliary tract: Secondary | ICD-10-CM

## 2012-11-16 DIAGNOSIS — K8051 Calculus of bile duct without cholangitis or cholecystitis with obstruction: Secondary | ICD-10-CM

## 2012-11-16 DIAGNOSIS — K8020 Calculus of gallbladder without cholecystitis without obstruction: Secondary | ICD-10-CM

## 2012-11-16 HISTORY — PX: ERCP: SHX60

## 2012-11-16 HISTORY — PX: ERCP: SHX5425

## 2012-11-16 HISTORY — DX: Calculus of bile duct without cholangitis or cholecystitis with obstruction: K80.51

## 2012-11-16 LAB — CBC
HCT: 36.7 % (ref 36.0–46.0)
Hemoglobin: 12.8 g/dL (ref 12.0–15.0)
MCV: 86.8 fL (ref 78.0–100.0)
RBC: 4.23 MIL/uL (ref 3.87–5.11)
RDW: 14 % (ref 11.5–15.5)
WBC: 2.2 10*3/uL — ABNORMAL LOW (ref 4.0–10.5)

## 2012-11-16 LAB — COMPREHENSIVE METABOLIC PANEL
Albumin: 2.9 g/dL — ABNORMAL LOW (ref 3.5–5.2)
BUN: 5 mg/dL — ABNORMAL LOW (ref 6–23)
Calcium: 9.2 mg/dL (ref 8.4–10.5)
Creatinine, Ser: 0.78 mg/dL (ref 0.50–1.10)
GFR calc Af Amer: >90 mL/min (ref >90)
Glucose, Bld: 120 mg/dL — ABNORMAL HIGH (ref 70–99)
Potassium: 3.5 mEq/L (ref 3.5–5.1)
Total Protein: 6.1 g/dL (ref 6.0–8.3)

## 2012-11-16 SURGERY — ERCP, WITH INTERVENTION IF INDICATED
Anesthesia: General

## 2012-11-16 SURGERY — ERCP, WITH INTERVENTION IF INDICATED
Anesthesia: Moderate Sedation

## 2012-11-16 MED ORDER — SODIUM CHLORIDE 0.9 % IV SOLN
INTRAVENOUS | Status: DC | PRN
  Administered 2012-11-16: 16:00:00

## 2012-11-16 MED ORDER — SODIUM CHLORIDE 0.9 % IV SOLN
1.5000 g | Freq: Once | INTRAVENOUS | Status: DC
  Administered 2012-11-16: 1.5 g via INTRAVENOUS
  Filled 2012-11-16: qty 1.5

## 2012-11-16 MED ORDER — FENTANYL CITRATE 0.05 MG/ML IJ SOLN
INTRAMUSCULAR | Status: DC | PRN
  Administered 2012-11-16 (×2): 50 ug via INTRAVENOUS

## 2012-11-16 MED ORDER — PROPOFOL 10 MG/ML IV BOLUS
INTRAVENOUS | Status: DC | PRN
  Administered 2012-11-16: 160 mg via INTRAVENOUS

## 2012-11-16 MED ORDER — PHENYLEPHRINE HCL 10 MG/ML IJ SOLN
INTRAMUSCULAR | Status: DC | PRN
  Administered 2012-11-16: 80 ug via INTRAVENOUS

## 2012-11-16 MED ORDER — LACTATED RINGERS IV SOLN
INTRAVENOUS | Status: DC
  Administered 2012-11-16: 14:00:00 via INTRAVENOUS
  Administered 2012-11-18: 20 mL/h via INTRAVENOUS

## 2012-11-16 MED ORDER — KETOROLAC TROMETHAMINE 30 MG/ML IJ SOLN: 15.0000 mg | Freq: Once | INTRAMUSCULAR | Status: DC | PRN

## 2012-11-16 MED ORDER — SODIUM CHLORIDE 0.9 % IV SOLN
1.5000 g | Freq: Once | INTRAVENOUS | Status: DC
  Filled 2012-11-16: qty 1.5

## 2012-11-16 MED ORDER — ARTIFICIAL TEARS OP OINT
TOPICAL_OINTMENT | OPHTHALMIC | Status: DC | PRN
  Administered 2012-11-16: 1 via OPHTHALMIC

## 2012-11-16 MED ORDER — SODIUM CHLORIDE 0.9 % IV SOLN: INTRAVENOUS | Status: DC

## 2012-11-16 MED ORDER — SUCCINYLCHOLINE CHLORIDE 20 MG/ML IJ SOLN
INTRAMUSCULAR | Status: DC | PRN
  Administered 2012-11-16: 120 mg via INTRAVENOUS

## 2012-11-16 MED ORDER — ONDANSETRON HCL 4 MG/2ML IJ SOLN
INTRAMUSCULAR | Status: DC | PRN
  Administered 2012-11-16: 4 mg via INTRAVENOUS

## 2012-11-16 MED ORDER — ONDANSETRON HCL 4 MG/2ML IJ SOLN: 4.0000 mg | Freq: Once | INTRAMUSCULAR | Status: DC | PRN

## 2012-11-16 MED ORDER — LIDOCAINE HCL 4 % MT SOLN
OROMUCOSAL | Status: DC | PRN
  Administered 2012-11-16: 4 mL via TOPICAL

## 2012-11-16 MED ORDER — LACTATED RINGERS IV SOLN
INTRAVENOUS | Status: DC | PRN
  Administered 2012-11-16: 14:00:00 via INTRAVENOUS

## 2012-11-16 MED ORDER — LIDOCAINE HCL (CARDIAC) 20 MG/ML IV SOLN
INTRAVENOUS | Status: DC | PRN
  Administered 2012-11-16: 50 mg via INTRAVENOUS

## 2012-11-16 MED ORDER — HYDROMORPHONE HCL PF 1 MG/ML IJ SOLN: 0.2500 mg | INTRAMUSCULAR | Status: DC | PRN

## 2012-11-16 MED ORDER — MIDAZOLAM HCL 5 MG/5ML IJ SOLN: INTRAMUSCULAR | Status: DC | PRN

## 2012-11-16 NOTE — Progress Notes (Signed)
[facility] Progress Note  Patient Name: Angela [name] Date of Encounter: 11/16/2012, 8:21 AM    Subjective  She says abdominal pain is improved. + flatus, no defecation.   Objective    Physical Exam: Filed Vitals:   11/16/12 0545  BP: 116/72  Pulse: 64  Temp: 98.3 F (36.8 C)  Resp: 15   General: NAD Lungs: clear Heart:  S1S2 no rmg Abdomen: soft, BS+ , mildly tender RUQ Psych: appropriate    Labs:  Recent Labs  11/14/12 0624 11/16/12 0620  NA 144 142  K 3.7 3.5  CL 108 106  CO2 27 26  GLUCOSE 112* 120*  BUN 7 5*  CREATININE 0.77 0.78  CALCIUM 8.8 9.2    Recent Labs  11/15/12 0857 11/16/12 0620  AST 232* 179*  ALT 382* 320*  ALKPHOS 193* 199*  BILITOT 5.7* 6.1*  PROT 6.3 6.1  ALBUMIN 3.1* 2.9*    Recent Labs  11/13/12 1747  LIPASE 81*    Recent Labs  11/14/12 0624 11/16/12 0620  WBC 2.4* 2.2*  HGB 13.0 12.8  HCT 38.3 36.7  MCV 89.1 86.8  PLT 183 165    Radiology/Studies:  US Abdomen Complete  11/13/2012  *RADIOLOGY REPORT*  Clinical Data:  Abdominal pain.  Rule out gallstones.  Vomiting. Recent kidney infection.  COMPLETE ABDOMINAL ULTRASOUND  Comparison:  CT of the abdomen and pelvis 04/30/2007  Findings:  Gallbladder:  Within the gallbladder, multiple small mobile stones are identified, measuring 6 mm in diameter and smaller. Gallbladder wall is upper limits normal in thickness, 3.6 mm.  No pericholecystic fluid identified.  No sonographic Murphy's sign.  Common bile duct:  Dilated, 11.3 mm.  Distal duct is not well seen.  Liver:  Echogenic without focal mass.  IVC:  Appears normal.  Pancreas:  Pancreatic duct is mildly prominent at 2.3 mm.  Spleen:  5.6 cm, normal in appearance.  Right Kidney:  10.8 cm, normal in appearance.  Left Kidney:  9.9 cm.  Simple cyst is 2.5 x 2.4 x 2.4 cm in mid pole region.  Abdominal aorta:  Not aneurysmal, 2.7 cm maximum diameter.  IMPRESSION:  1.  Multiple mobile gallstones. 2.  Dilated common  bile duct raises the question of distal duct stones or other obstruction.  Distal duct is not well seen however.Consider further evaluation with CT of the abdomen with contrast. 3.  Left renal cyst.   Original Report Authenticated By: Norva Pavlov, M.D.      Assessment and Plan  1) Cholelithiasis 2) Dilated CBD and increasing bilirubin - suspect choledocholithiasis 3) mildly elevated lipase - not high enough to indicate mpancreatitis  Plan for ERCP today. The risks and benefits as well as alternatives of endoscopic procedure(s) have been discussed and reviewed. All questions answered. The patient agrees to proceed.   Iva Boop, MD, [facility] [phone] (pager) 11/16/2012 8:24 AM

## 2012-11-16 NOTE — Op Note (Signed)
[facility] [address] [place], [address]   ERCP PROCEDURE REPORT  PATIENT: Angela [name], Angela [name].  MR# :[id number] BIRTHDATE: 01-Aug-1941  GENDER: Female ENDOSCOPIST: Iva Boop, MD, [facility] REFERRED BY: [facility] PROCEDURE DATE:  11/16/2012 PROCEDURE:   ERCP with sphincterotomy/papillotomy and ERCP with removal of calculus/calculi ASA CLASS:   Class III INDICATIONS:suspected or rule out bile duct stones. Jaundice and dilated bile duct. MEDICATIONS: See Anesthesia Report. TOPICAL ANESTHETIC: None  DESCRIPTION OF PROCEDURE:   After the risks benefits and alternatives of the procedure were thoroughly explained, informed consent was obtained.  The Pentax ERCP [id number]  endoscope was introduced through the mouth  and advanced to the second portion of the duodenum . The esopphagus was not seen well, the stomach and duodenum were normal. Access to the ampulla obtaine, 035 wire inserted - ended up in pancreatic duct - contrast injection performed (partial). The wire was left in and a second wire was placed into the bile duct, allowing contrast injection. The pancreatic wire was removed.  1.  There was dilation of the common bile duct and intrahepatic ducts.CBD max 12-13 mm. 2.  With guidewire in the bile duct, a large biliary sphincterotomy was performed using the sphincterotome. 3.  A single, cholesterol appearing, stone was recovered.  It was about 8-9 mm and came out with the sphincterotomy 4.  balloon sweep performed and no other stones, occlusion cholangiogram negative 5.  Partial pancreatogram normal The scope was then completely withdrawn from the patient and the procedure terminated.     COMPLICATIONS: .  There were no complications.  ENDOSCOPIC IMPRESSION: 1.   There was dilation of the common bile duct and intrahepatic ducts.  dilated duct(s) 2.   With guidewire in the bile duct, a large biliary sphincterotomy was performed  using the sphincterotome. 3.   Single stone revied from bile duct, no others seen. 4.   Partial pancreatogram normal 5.   Gallbladder not filled  RECOMMENDATIONS: 1.  cholecystectomy 2.   Clear liquids tonight eSigned:  Iva Boop, MD, [facility] 11/16/2012 3:32 PM

## 2012-11-16 NOTE — OR Nursing (Signed)
Patient positioned prone on chest rolls with foam head cradle, left arm flexed towards headon armboard padded with foam and secured with velcro strap, left axillary foam roll, right arm papoose-tucked with draw sheet to side with foam, foam under knees, three pillows under lower legs, and safety strap across upper posterior thighs. Stan Head, MD verified positioning.  Patient positioned by Oralia Manis, RN; Heron Sabins, [employer] Nursing Student; Baldwin Crown, RN; Verdie Mosher, RN; Glori Luis, CRNA; Noreene Larsson, MD; Stan Head, MD; and endo staff.

## 2012-11-16 NOTE — Anesthesia Postprocedure Evaluation (Signed)
  Anesthesia Post-op Note  Patient: Angela [name]  Procedure(s) Performed: Procedure(s): ENDOSCOPIC RETROGRADE CHOLANGIOPANCREATOGRAPHY (ERCP) (N/A)  Patient Location: PACU  Anesthesia Type:General  Level of Consciousness: awake, alert  and oriented  Airway and Oxygen Therapy: Patient Spontanous Breathing and Patient connected to nasal cannula oxygen  Post-op Pain: none  Post-op Assessment: Post-op Vital signs reviewed, Patient's Cardiovascular Status Stable, Respiratory Function Stable, Patent Airway and Pain level controlled  Post-op Vital Signs: stable  Complications: No apparent anesthesia complications

## 2012-11-16 NOTE — Anesthesia Preprocedure Evaluation (Addendum)
Anesthesia Evaluation  Patient identified by MRN, date of birth, ID band Patient awake    Reviewed: Allergy & Precautions, H&P , NPO status , Patient's Chart, lab work & pertinent test results  History of Anesthesia Complications Negative for: history of anesthetic complications  Airway Mallampati: II TM Distance: >3 FB Neck ROM: Full    Dental  (+) Teeth Intact and Dental Advisory Given   Pulmonary neg pulmonary ROS,  breath sounds clear to auscultation        Cardiovascular Rhythm:Regular Rate:Normal     Neuro/Psych    GI/Hepatic GERD-  Medicated and Controlled,  Endo/Other    Renal/GU      Musculoskeletal   Abdominal (+) + obese,   Peds  Hematology   Anesthesia Other Findings   Reproductive/Obstetrics                          Anesthesia Physical Anesthesia Plan  ASA: III  Anesthesia Plan: General   Post-op Pain Management:    Induction: Intravenous  Airway Management Planned: Oral ETT  Additional Equipment:   Intra-op Plan:   Post-operative Plan: Extubation in OR  Informed Consent: I have reviewed the patients History and Physical, chart, labs and discussed the procedure including the risks, benefits and alternatives for the proposed anesthesia with the patient or authorized representative who has indicated his/her understanding and acceptance.   Dental advisory given  Plan Discussed with: Anesthesiologist and Surgeon  Anesthesia Plan Comments:        Anesthesia Quick Evaluation

## 2012-11-16 NOTE — Progress Notes (Signed)
Patient ID: Angela [name], female   DOB: [birth year], 72 y.o.   MRN: [id number]    Subjective: Pt feels ok.  No c/o  Objective: Vital signs in last 24 hours: Temp:  [97.6 F (36.4 C)-99.3 F (37.4 C)] 98.3 F (36.8 C) (03/17 0545) Pulse Rate:  [64-73] 64 (03/17 0545) Resp:  [15-16] 15 (03/17 0545) BP: (116-131)/(72-86) 116/72 mmHg (03/17 0545) SpO2:  [98 %-100 %] 98 % (03/17 0545) Last BM Date: 11/12/12  Intake/Output from previous day: 03/16 0701 - 03/17 0700 In: 480 [P.O.:480] Out: -  Intake/Output this shift:    PE: Abd: soft, minimally tender, +BS, ND, obese  Lab Results:   Recent Labs  11/14/12 0624 11/16/12 0620  WBC 2.4* 2.2*  HGB 13.0 12.8  HCT 38.3 36.7  PLT 183 165   BMET  Recent Labs  11/14/12 0624 11/16/12 0620  NA 144 142  K 3.7 3.5  CL 108 106  CO2 27 26  GLUCOSE 112* 120*  BUN 7 5*  CREATININE 0.77 0.78  CALCIUM 8.8 9.2   PT/INR No results found for this basename: LABPROT, INR,  in the last 72 hours CMP     Component Value Date/Time   NA 142 11/16/2012 0620   NA 141 06/16/2012 1333   K 3.5 11/16/2012 0620   K 3.4* 06/16/2012 1333   CL 106 11/16/2012 0620   CL 107 06/16/2012 1333   CO2 26 11/16/2012 0620   CO2 23 06/16/2012 1333   GLUCOSE 120* 11/16/2012 0620   GLUCOSE 133* 06/16/2012 1333   BUN 5* 11/16/2012 0620   BUN 14.0 06/16/2012 1333   CREATININE 0.78 11/16/2012 0620   CREATININE 0.7 06/16/2012 1333   CALCIUM 9.2 11/16/2012 0620   CALCIUM 9.5 06/16/2012 1333   PROT 6.1 11/16/2012 0620   PROT 6.3* 06/16/2012 1333   ALBUMIN 2.9* 11/16/2012 0620   ALBUMIN 3.4* 06/16/2012 1333   AST 179* 11/16/2012 0620   AST 16 06/16/2012 1333   ALT 320* 11/16/2012 0620   ALT 16 06/16/2012 1333   ALKPHOS 199* 11/16/2012 0620   ALKPHOS 87 06/16/2012 1333   BILITOT 6.1* 11/16/2012 0620   BILITOT 0.30 06/16/2012 1333   GFRNONAA 83* 11/16/2012 0620   GFRAA >90 11/16/2012 0620   Lipase     Component Value Date/Time   LIPASE 81* 11/13/2012 1747        Studies/Results: No results found.  Anti-infectives: Anti-infectives   None       Assessment/Plan  1. Choledocholithiasis  Plan: 1. Plan for ERCP today by GI. 2. Can plan for lap chole later this week; however, if patient wants to go home and proceed with lap chole later she may do that as well as she does not have acute cholecystitis.  Will follow  LOS: 3 days    OSBORNE,KELLY E 11/16/2012, 9:37 AM Pager: [phone]   Rising LFT's.  Plan for ERCP today.  Lap chole after CBD cleared.

## 2012-11-16 NOTE — Preoperative (Signed)
Beta Blockers   Reason not to administer Beta Blockers: not prescribed 

## 2012-11-16 NOTE — Transfer of Care (Signed)
Immediate Anesthesia Transfer of Care Note  Patient: Angela [name]  Procedure(s) Performed: Procedure(s): ENDOSCOPIC RETROGRADE CHOLANGIOPANCREATOGRAPHY (ERCP) (N/A)  Patient Location: PACU  Anesthesia Type:General  Level of Consciousness: awake, alert  and oriented  Airway & Oxygen Therapy: Patient Spontanous Breathing and Patient connected to nasal cannula oxygen  Post-op Assessment: Report given to PACU RN, Post -op Vital signs reviewed and stable and Patient moving all extremities X 4  Post vital signs: Reviewed and stable  Complications: No apparent anesthesia complications

## 2012-11-16 NOTE — Anesthesia Procedure Notes (Signed)
Procedure Name: Intubation Date/Time: 11/16/2012 2:42 PM Performed by: Gayla Medicus Pre-anesthesia Checklist: Patient identified, Emergency Drugs available, Timeout performed, Suction available and Patient being monitored Patient Re-evaluated:Patient Re-evaluated prior to inductionOxygen Delivery Method: Circle system utilized Preoxygenation: Pre-oxygenation with 100% oxygen Intubation Type: IV induction Laryngoscope Size: Mac and 3 Grade View: Grade II Tube type: Oral Tube size: 7.5 mm Number of attempts: 1 Airway Equipment and Method: Stylet and LTA kit utilized Placement Confirmation: ETT inserted through vocal cords under direct vision,  positive ETCO2 and breath sounds checked- equal and bilateral Secured at: 21 cm Tube secured with: Tape Dental Injury: Teeth and Oropharynx as per pre-operative assessment

## 2012-11-16 NOTE — Progress Notes (Signed)
[facility]             Progress Note   Subjective: Sitting up in chair. Minimal pain. No nausea.  Objective: Vital signs in last 24 hours: Temp:  [97.6 F (36.4 C)-99.3 F (37.4 C)] 98.3 F (36.8 C) (03/17 1253) Pulse Rate:  [64-73] 70 (03/17 1253) Resp:  [15-18] 18 (03/17 1253) BP: (116-131)/(68-86) 122/68 mmHg (03/17 1253) SpO2:  [98 %-100 %] 100 % (03/17 1253) Weight change:  Last BM Date: 11/12/12  Intake/Output from previous day: 03/16 0701 - 03/17 0700 In: 480 [P.O.:480] Out: -      Physical Exam: General: Alert, awake, oriented x3, in no acute distress. HEENT: No bruits, no goiter. Heart: Regular rate and rhythm, without murmurs, rubs, gallops. Lungs: Clear to auscultation bilaterally. Abdomen: Soft, nontender, nondistended, positive bowel sounds. Extremities: No clubbing cyanosis or edema with positive pedal pulses. Neuro: Grossly intact, nonfocal.    Lab Results: Basic Metabolic Panel:  Recent Labs  41/32/44 0624 11/16/12 0620  NA 144 142  K 3.7 3.5  CL 108 106  CO2 27 26  GLUCOSE 112* 120*  BUN 7 5*  CREATININE 0.77 0.78  CALCIUM 8.8 9.2   Liver Function Tests:  Recent Labs  11/15/12 0857 11/16/12 0620  AST 232* 179*  ALT 382* 320*  ALKPHOS 193* 199*  BILITOT 5.7* 6.1*  PROT 6.3 6.1  ALBUMIN 3.1* 2.9*    Recent Labs  11/13/12 1747  LIPASE 81*   CBC:  Recent Labs  11/14/12 0624 11/16/12 0620  WBC 2.4* 2.2*  HGB 13.0 12.8  HCT 38.3 36.7  MCV 89.1 86.8  PLT 183 165   Urinalysis:  Recent Labs  11/13/12 1728  COLORURINE YELLOW  LABSPEC 1.003*  PHURINE 7.5  GLUCOSEU NEGATIVE  HGBUR NEGATIVE  BILIRUBINUR NEGATIVE  KETONESUR NEGATIVE  PROTEINUR NEGATIVE  UROBILINOGEN 0.2  NITRITE NEGATIVE  LEUKOCYTESUR MODERATE*    Recent Results (from the past 240 hour(s))  MRSA PCR SCREENING     Status: None   Collection Time    11/15/12  6:38 AM      Result Value Range Status   MRSA by PCR NEGATIVE   NEGATIVE Final   Comment:            The GeneXpert MRSA Assay (FDA     approved for NASAL specimens     only), is one component of a     comprehensive MRSA colonization     surveillance program. It is not     intended to diagnose MRSA     infection nor to guide or     monitor treatment for     MRSA infections.    Studies/Results: No results found.  Medications: Scheduled Meds: . anastrozole  1 mg Oral Daily  . loteprednol  2 drop Right Eye BID  . pantoprazole (PROTONIX) IV  40 mg Intravenous Q12H  . timolol  1 drop Right Eye QHS   Continuous Infusions: . sodium chloride 75 mL/hr (11/14/12 2303)   PRN Meds:.acetaminophen, acetaminophen, HYDROmorphone (DILAUDID) injection, ondansetron (ZOFRAN) IV, ondansetron  Assessment/Plan:  Principal Problem:   Pancreatitis due to common bile duct stone Active Problems:   Breast cancer   Arthritis   Glaucoma   Hypercholesterolemia   Nausea and vomiting   Abdominal pain, right upper quadrant   Cholelithiasis   Dilated bile duct   Jaundice    Gallstone Pancreatitis -Bili rising. -ERCP is planned for today. -Will likely need a cholecystectomy this admission as well.  Time spent coordinating care: 35 minutes.   LOS: 3 days   [facility] Pager: [phone] 11/16/2012, 12:56 PM

## 2012-11-17 ENCOUNTER — Encounter (HOSPITAL_COMMUNITY): Payer: Self-pay | Admitting: Internal Medicine

## 2012-11-17 DIAGNOSIS — R1011 Right upper quadrant pain: Secondary | ICD-10-CM

## 2012-11-17 DIAGNOSIS — K859 Acute pancreatitis without necrosis or infection, unspecified: Principal | ICD-10-CM

## 2012-11-17 LAB — COMPREHENSIVE METABOLIC PANEL
ALT: 281 U/L — ABNORMAL HIGH (ref 0–35)
Alkaline Phosphatase: 195 U/L — ABNORMAL HIGH (ref 39–117)
BUN: 8 mg/dL (ref 6–23)
CO2: 27 mEq/L (ref 19–32)
Calcium: 8.9 mg/dL (ref 8.4–10.5)
GFR calc Af Amer: >90 mL/min (ref >90)
GFR calc non Af Amer: 83 mL/min — ABNORMAL LOW (ref >90)
Glucose, Bld: 126 mg/dL — ABNORMAL HIGH (ref 70–99)
Sodium: 140 mEq/L (ref 135–145)

## 2012-11-17 LAB — LIPASE, BLOOD: Lipase: 1564 U/L — ABNORMAL HIGH (ref 11–59)

## 2012-11-17 NOTE — Progress Notes (Signed)
     [facility] Daily Rounding Note 11/17/2012, 8:44 AM  SUBJECTIVE:       Had pain late yesterday evening through the night to about 5 AM this morning located in the epigastrium which was moderate to severe at times Pain has improved dramatically. She has been n.p.o. Today No n/v or fever  OBJECTIVE:         Vital signs in last 24 hours:    Temp:  [98.2 F (36.8 C)-98.7 F (37.1 C)] 98.5 F (36.9 C) (03/18 0551) Pulse Rate:  [70-92] 83 (03/18 0551) Resp:  [12-18] 16 (03/18 0551) BP: (113-142)/(68-87) 118/73 mmHg (03/18 0551) SpO2:  [94 %-100 %] 100 % (03/18 0551) Last BM Date: 11/12/12 Gen: awake, alert, NAD HEENT: anicteric, op clear CV: RRR, no mrg Pulm: CTA b/l Abd: soft, area mild tenderness to deep palpation without rebound or guarding, nondistended, +BS throughout Ext: no c/c/e Neuro: nonfocal   Intake/Output from previous day: 03/17 0701 - 03/18 0700 In: 1695 [P.O.:120; I.V.:1575] Out: -   Intake/Output this shift:    Lab Results:  Recent Labs  11/16/12 0620  WBC 2.2*  HGB 12.8  HCT 36.7  PLT 165   BMET  Recent Labs  11/16/12 0620 11/17/12 0615  NA 142 140  K 3.5 3.5  CL 106 102  CO2 26 27  GLUCOSE 120* 126*  BUN 5* 8  CREATININE 0.78 0.76  CALCIUM 9.2 8.9   LFT  Recent Labs  11/15/12 0857 11/16/12 0620 11/17/12 0615  PROT 6.3 6.1 6.0  ALBUMIN 3.1* 2.9* 2.8*  AST 232* 179* 123*  ALT 382* 320* 281*  ALKPHOS 193* 199* 195*  BILITOT 5.7* 6.1* 2.2*  BILIDIR 4.7*  --   --   IBILI 1.0*  --   --    Lipase     Component Value Date/Time   LIPASE 1564* 11/17/2012 0615   Studies/Results: Dg Ercp With Sphincterotomy 11/16/2012   IMPRESSION: No evidence of bile duct stone.  These images were submitted for radiologic interpretation only. Please see the procedural report for the amount of contrast and the fluoroscopy time utilized.   Original Report Authenticated By: Hulan Saas, M.D.     ASSESMENT: *  Choledocholithiasis and dilated  CBD ERCP/sphinct/retrieval of single stone 11/16/12. Mild post ERCP pancreatitis   PLAN: I believe she had mild post ERCP pancreatitis and this is consistent with the elevated lipase. He has improved significantly and I don't expect this will continue to be an issue. Surgery delayed cholecystectomy today after discussion with Dr. Leone Payor do to mild pancreatitis Will give her clears until midnight I expect she will be ready for cholecystectomy tomorrow per surgery   LOS: 4 days   Jennye Moccasin  11/17/2012, 8:44 AM Pager: [phone]

## 2012-11-17 NOTE — Progress Notes (Signed)
As per Dr. Delice Lesch note - probably too early to say she has pancreatitis but might have that - it will depend upon clinical and laboratory findings tomorrow.   Iva Boop, MD, [facility]

## 2012-11-17 NOTE — Progress Notes (Signed)
She had some epigastric tenderness similar to admission today so I checked a lipase to evaluate.  It was 1564 and up from prior.  I was planning on cholecystectomy today but I have discussed this with Dr. Leone Payor and since she is having some epigastric pain and lipase elevated, it would probably best to ensure that this is coming down prior to surgery.  Will shoot for tomorrow as long as this is improving and she is doing well clinically.

## 2012-11-17 NOTE — Progress Notes (Signed)
1 Day Post-Op  Subjective: About the same today.  Some mild epigastric tenderness.  ERCP yesterday with stone retrieval  Objective: Vital signs in last 24 hours: Temp:  [98.2 F (36.8 C)-98.7 F (37.1 C)] 98.5 F (36.9 C) (03/18 0551) Pulse Rate:  [70-92] 83 (03/18 0551) Resp:  [12-18] 16 (03/18 0551) BP: (113-142)/(68-87) 118/73 mmHg (03/18 0551) SpO2:  [94 %-100 %] 100 % (03/18 0551) Last BM Date: 11/12/12  Intake/Output from previous day: 03/17 0701 - 03/18 0700 In: 1695 [P.O.:120; I.V.:1575] Out: -  Intake/Output this shift:    General appearance: alert, cooperative and no distress GI: soft, minimal epigastric tenderness, ND, no peritoneal signs  Lab Results:   Recent Labs  11/16/12 0620  WBC 2.2*  HGB 12.8  HCT 36.7  PLT 165   BMET  Recent Labs  11/16/12 0620 11/17/12 0615  NA 142 140  K 3.5 3.5  CL 106 102  CO2 26 27  GLUCOSE 120* 126*  BUN 5* 8  CREATININE 0.78 0.76  CALCIUM 9.2 8.9   PT/INR No results found for this basename: LABPROT, INR,  in the last 72 hours ABG No results found for this basename: PHART, PCO2, PO2, HCO3,  in the last 72 hours  Studies/Results: Dg Ercp With Sphincterotomy  11/16/2012  *RADIOLOGY REPORT*  Clinical Data: Cholelithiasis and common bile duct dilation on recent ultrasound.  ERCP WITH SPHINCTEROTOMY  Comparison:  None.  Technique:  Multiple spot images or obtained with the fluoroscopic device and submitted for interpretation post-procedure.  ERCP was performed by Dr. Leone Payor.  Findings: Initial images demonstrate mild dilation of the extrahepatic bile ducts, without intrahepatic dilation. Dr. Leone Payor swept the duct with the balloon catheter.  The completion image shows no filling defects within the duct.  IMPRESSION: No evidence of bile duct stone.  These images were submitted for radiologic interpretation only. Please see the procedural report for the amount of contrast and the fluoroscopy time utilized.   Original  Report Authenticated By: Hulan Saas, M.D.     Anti-infectives: Anti-infectives   Start     Dose/Rate Route Frequency Ordered Stop   11/16/12 1430  ampicillin-sulbactam (UNASYN) 1.5 g in sodium chloride 0.9 % 50 mL IVPB  Status:  Discontinued     1.5 g 100 mL/hr over 30 Minutes Intravenous  Once 11/16/12 1424 11/16/12 1631   11/16/12 1400  ampicillin-sulbactam (UNASYN) 1.5 g in sodium chloride 0.9 % 50 mL IVPB  Status:  Discontinued     1.5 g 100 mL/hr over 30 Minutes Intravenous  Once 11/16/12 1311 11/16/12 1424      Assessment/Plan: s/p Procedure(s): ENDOSCOPIC RETROGRADE CHOLANGIOPANCREATOGRAPHY (ERCP) (N/A) she is feeling okay.  no evidence of any postprocedure complication.  she has some epigastric tenderness so will check lipase and if not too high will plan for lap chole. I discussed with her the procedure and its risks.  The risks of infection, bleeding, pain, persistent symptoms, scarring, injury to bowel or bile ducts, retained stone, diarrhea, need for additional procedures, and need for open surgery discussed with the patient. She would like to proceed with cholecystectomy.    LOS: 4 days    Lodema Pilot DAVID 11/17/2012

## 2012-11-17 NOTE — Progress Notes (Signed)
[facility]             Progress Note   Subjective: Mild epigastric tenderness this am. No nausea.  Objective: Vital signs in last 24 hours: Temp:  [98.2 F (36.8 C)-98.7 F (37.1 C)] 98.5 F (36.9 C) (03/18 0551) Pulse Rate:  [70-92] 83 (03/18 0551) Resp:  [12-16] 16 (03/18 0551) BP: (113-142)/(73-87) 118/73 mmHg (03/18 0551) SpO2:  [94 %-100 %] 100 % (03/18 0551) Weight change:  Last BM Date: 11/12/12  Intake/Output from previous day: 03/17 0701 - 03/18 0700 In: 1695 [P.O.:120; I.V.:1575] Out: -      Physical Exam: General: Alert, awake, oriented x3. HEENT: No bruits, no goiter. Heart: Regular rate and rhythm, without murmurs, rubs, gallops. Lungs: Clear to auscultation bilaterally. Abdomen: Soft, nontender, nondistended, positive bowel sounds. Extremities: No clubbing cyanosis or edema with positive pedal pulses. Neuro: Grossly intact, nonfocal.    Lab Results: Basic Metabolic Panel:  Recent Labs  16/10/96 0620 11/17/12 0615  NA 142 140  K 3.5 3.5  CL 106 102  CO2 26 27  GLUCOSE 120* 126*  BUN 5* 8  CREATININE 0.78 0.76  CALCIUM 9.2 8.9   Liver Function Tests:  Recent Labs  11/16/12 0620 11/17/12 0615  AST 179* 123*  ALT 320* 281*  ALKPHOS 199* 195*  BILITOT 6.1* 2.2*  PROT 6.1 6.0  ALBUMIN 2.9* 2.8*    Recent Labs  11/17/12 0615  LIPASE 1564*   CBC:  Recent Labs  11/16/12 0620  WBC 2.2*  HGB 12.8  HCT 36.7  MCV 86.8  PLT 165   Urinalysis: No results found for this basename: COLORURINE, APPERANCEUR, LABSPEC, PHURINE, GLUCOSEU, HGBUR, BILIRUBINUR, KETONESUR, PROTEINUR, UROBILINOGEN, NITRITE, LEUKOCYTESUR,  in the last 72 hours  Recent Results (from the past 240 hour(s))  MRSA PCR SCREENING     Status: None   Collection Time    11/15/12  6:38 AM      Result Value Range Status   MRSA by PCR NEGATIVE  NEGATIVE Final   Comment:            The GeneXpert MRSA Assay (FDA     approved for NASAL specimens   only), is one component of a     comprehensive MRSA colonization     surveillance program. It is not     intended to diagnose MRSA     infection nor to guide or     monitor treatment for     MRSA infections.    Studies/Results: Dg Ercp With Sphincterotomy  11/16/2012  *RADIOLOGY REPORT*  Clinical Data: Cholelithiasis and common bile duct dilation on recent ultrasound.  ERCP WITH SPHINCTEROTOMY  Comparison:  None.  Technique:  Multiple spot images or obtained with the fluoroscopic device and submitted for interpretation post-procedure.  ERCP was performed by Dr. Leone Payor.  Findings: Initial images demonstrate mild dilation of the extrahepatic bile ducts, without intrahepatic dilation. Dr. Leone Payor swept the duct with the balloon catheter.  The completion image shows no filling defects within the duct.  IMPRESSION: No evidence of bile duct stone.  These images were submitted for radiologic interpretation only. Please see the procedural report for the amount of contrast and the fluoroscopy time utilized.   Original Report Authenticated By: Hulan Saas, M.D.     Medications: Scheduled Meds: . anastrozole  1 mg Oral Daily  . loteprednol  2 drop Right Eye BID  . pantoprazole (PROTONIX) IV  40 mg Intravenous Q12H  . timolol  1 drop Right  Eye QHS   Continuous Infusions: . sodium chloride 75 mL/hr (11/14/12 2303)  . lactated ringers 20 mL/hr at 11/16/12 1333   PRN Meds:.acetaminophen, acetaminophen, HYDROmorphone (DILAUDID) injection, ondansetron (ZOFRAN) IV, ondansetron  Assessment/Plan:  Active Problems:   Breast cancer   Arthritis   Glaucoma   Hypercholesterolemia   Nausea and vomiting   Abdominal pain, right upper quadrant   Cholelithiasis   Choledocholithiasis with obstruction   Jaundice    Gallstone Pancreatitis -ERCP with stone removal 3/17 succesful and bili trending down. -Lap chole was planned for today, but has been cancelled due to ??post-ERCP pancreatitis. -Lap  chole possibly in am.  Time spent coordinating care: 15 minutes.   LOS: 4 days   [facility] Pager: [phone] 11/17/2012, 1:27 PM

## 2012-11-18 ENCOUNTER — Encounter (HOSPITAL_COMMUNITY): Payer: Self-pay | Admitting: Anesthesiology

## 2012-11-18 ENCOUNTER — Inpatient Hospital Stay (HOSPITAL_COMMUNITY): Payer: Medicare Other

## 2012-11-18 ENCOUNTER — Encounter (HOSPITAL_COMMUNITY)
Admission: EM | Disposition: A | Discharge status: Home / Self Care | Payer: Self-pay | Source: Home / Self Care | Attending: Internal Medicine

## 2012-11-18 ENCOUNTER — Inpatient Hospital Stay (HOSPITAL_COMMUNITY): Payer: Medicare Other | Admitting: Anesthesiology

## 2012-11-18 HISTORY — PX: CHOLECYSTECTOMY: SHX55

## 2012-11-18 LAB — HEPATIC FUNCTION PANEL
ALT: 224 U/L — ABNORMAL HIGH (ref 0–35)
AST: 87 U/L — ABNORMAL HIGH (ref 0–37)
Albumin: 2.8 g/dL — ABNORMAL LOW (ref 3.5–5.2)
Alkaline Phosphatase: 172 U/L — ABNORMAL HIGH (ref 39–117)
Total Bilirubin: 1.8 mg/dL — ABNORMAL HIGH (ref 0.3–1.2)
Total Protein: 6 g/dL (ref 6.0–8.3)

## 2012-11-18 SURGERY — LAPAROSCOPIC CHOLECYSTECTOMY WITH INTRAOPERATIVE CHOLANGIOGRAM
Anesthesia: General | Site: Abdomen | Wound class: Clean Contaminated

## 2012-11-18 MED ORDER — ONDANSETRON HCL 4 MG/2ML IJ SOLN
INTRAMUSCULAR | Status: AC
  Administered 2012-11-18: 4 mg via INTRAVENOUS
  Filled 2012-11-18: qty 2

## 2012-11-18 MED ORDER — SODIUM CHLORIDE 0.9 % IR SOLN
Status: DC | PRN
  Administered 2012-11-18: 1000 mL

## 2012-11-18 MED ORDER — FENTANYL CITRATE 0.05 MG/ML IJ SOLN: 25.0000 ug | INTRAMUSCULAR | Status: DC | PRN

## 2012-11-18 MED ORDER — PROPOFOL 10 MG/ML IV BOLUS
INTRAVENOUS | Status: DC | PRN
  Administered 2012-11-18: 160 mg via INTRAVENOUS

## 2012-11-18 MED ORDER — HYDROCODONE-ACETAMINOPHEN 5-325 MG PO TABS
1.0000 | ORAL_TABLET | ORAL | Status: DC | PRN
  Administered 2012-11-18: 1 via ORAL
  Filled 2012-11-18: qty 1

## 2012-11-18 MED ORDER — LACTATED RINGERS IV SOLN
INTRAVENOUS | Status: DC | PRN
  Administered 2012-11-18: 08:00:00 via INTRAVENOUS

## 2012-11-18 MED ORDER — CEFAZOLIN SODIUM-DEXTROSE 2-3 GM-% IV SOLR
2.0000 g | Freq: Once | INTRAVENOUS | Status: AC
  Administered 2012-11-18: 2 g via INTRAVENOUS

## 2012-11-18 MED ORDER — LIDOCAINE-EPINEPHRINE (PF) 1 %-1:200000 IJ SOLN
INTRAMUSCULAR | Status: DC | PRN
  Administered 2012-11-18: 10:00:00 via INTRAMUSCULAR

## 2012-11-18 MED ORDER — MIDAZOLAM HCL 5 MG/5ML IJ SOLN
INTRAMUSCULAR | Status: DC | PRN
  Administered 2012-11-18: 1 mg via INTRAVENOUS

## 2012-11-18 MED ORDER — ONDANSETRON HCL 4 MG/2ML IJ SOLN: 4.0000 mg | Freq: Four times a day (QID) | INTRAMUSCULAR | Status: AC | PRN

## 2012-11-18 MED ORDER — FENTANYL CITRATE 0.05 MG/ML IJ SOLN
INTRAMUSCULAR | Status: DC | PRN
  Administered 2012-11-18: 100 ug via INTRAVENOUS
  Administered 2012-11-18: 150 ug via INTRAVENOUS
  Administered 2012-11-18: 50 ug via INTRAVENOUS

## 2012-11-18 MED ORDER — SUCCINYLCHOLINE CHLORIDE 20 MG/ML IJ SOLN
INTRAMUSCULAR | Status: DC | PRN
  Administered 2012-11-18: 100 mg via INTRAVENOUS

## 2012-11-18 MED ORDER — LIDOCAINE HCL (CARDIAC) 20 MG/ML IV SOLN
INTRAVENOUS | Status: DC | PRN
  Administered 2012-11-18: 65 mg via INTRAVENOUS

## 2012-11-18 MED ORDER — ONDANSETRON HCL 4 MG/2ML IJ SOLN
INTRAMUSCULAR | Status: DC | PRN
  Administered 2012-11-18: 4 mg via INTRAVENOUS

## 2012-11-18 MED ORDER — 0.9 % SODIUM CHLORIDE (POUR BTL) OPTIME
TOPICAL | Status: DC | PRN
  Administered 2012-11-18: 1000 mL

## 2012-11-18 MED ORDER — SODIUM CHLORIDE 0.9 % IV SOLN
INTRAVENOUS | Status: DC | PRN
  Administered 2012-11-18: 10:00:00

## 2012-11-18 SURGICAL SUPPLY — 45 items
ADH SKN CLS APL DERMABOND .7 (GAUZE/BANDAGES/DRESSINGS) ×1
APPLIER CLIP ROT 10 11.4 M/L (STAPLE) ×2
APR CLP MED LRG 11.4X10 (STAPLE) ×1
BAG SPEC RTRVL LRG 6X4 10 (ENDOMECHANICALS) ×1
CANISTER SUCTION 2500CC (MISCELLANEOUS) ×2 IMPLANT
CATH REDDICK CHOLANGI 4FR 50CM (CATHETERS) ×2 IMPLANT
CHLORAPREP W/TINT 26ML (MISCELLANEOUS) ×2 IMPLANT
CLIP APPLIE ROT 10 11.4 M/L (STAPLE) ×1 IMPLANT
CLOTH BEACON ORANGE TIMEOUT ST (SAFETY) ×2 IMPLANT
COVER SURGICAL LIGHT HANDLE (MISCELLANEOUS) ×2 IMPLANT
DECANTER SPIKE VIAL GLASS SM (MISCELLANEOUS) ×4 IMPLANT
DERMABOND ADVANCED (GAUZE/BANDAGES/DRESSINGS) ×1
DERMABOND ADVANCED .7 DNX12 (GAUZE/BANDAGES/DRESSINGS) ×1 IMPLANT
DRAPE C-ARM 42X72 X-RAY (DRAPES) ×2 IMPLANT
ELECT CAUTERY BLADE 6.4 (BLADE) ×2 IMPLANT
ELECT REM PT RETURN 9FT ADLT (ELECTROSURGICAL) ×2
ELECTRODE REM PT RTRN 9FT ADLT (ELECTROSURGICAL) ×1 IMPLANT
ENDOLOOP SUT PDS II  0 18 (SUTURE) ×1
ENDOLOOP SUT PDS II 0 18 (SUTURE) IMPLANT
GLOVE BIO SURGEON STRL SZ 6 (GLOVE) ×1 IMPLANT
GLOVE BIOGEL PI IND STRL 7.0 (GLOVE) IMPLANT
GLOVE BIOGEL PI INDICATOR 7.0 (GLOVE) ×2
GLOVE SURG SS PI 6.0 STRL IVOR (GLOVE) ×1 IMPLANT
GLOVE SURG SS PI 7.0 STRL IVOR (GLOVE) ×1 IMPLANT
GLOVE SURG SS PI 7.5 STRL IVOR (GLOVE) ×4 IMPLANT
GOWN STRL NON-REIN LRG LVL3 (GOWN DISPOSABLE) ×6 IMPLANT
GOWN STRL REIN XL XLG (GOWN DISPOSABLE) ×2 IMPLANT
IV CATH 14GX2 1/4 (CATHETERS) ×2 IMPLANT
KIT BASIN OR (CUSTOM PROCEDURE TRAY) ×2 IMPLANT
KIT ROOM TURNOVER OR (KITS) ×2 IMPLANT
NS IRRIG 1000ML POUR BTL (IV SOLUTION) ×2 IMPLANT
PAD ARMBOARD 7.5X6 YLW CONV (MISCELLANEOUS) ×2 IMPLANT
PENCIL BUTTON HOLSTER BLD 10FT (ELECTRODE) ×2 IMPLANT
POUCH SPECIMEN RETRIEVAL 10MM (ENDOMECHANICALS) ×2 IMPLANT
SET IRRIG TUBING LAPAROSCOPIC (IRRIGATION / IRRIGATOR) ×2 IMPLANT
SLEEVE ENDOPATH XCEL 5M (ENDOMECHANICALS) ×2 IMPLANT
SPECIMEN JAR SMALL (MISCELLANEOUS) ×2 IMPLANT
SUT MNCRL AB 4-0 PS2 18 (SUTURE) ×4 IMPLANT
SUT VICRYL 0 UR6 27IN ABS (SUTURE) ×2 IMPLANT
TOWEL OR 17X24 6PK STRL BLUE (TOWEL DISPOSABLE) ×2 IMPLANT
TOWEL OR 17X26 10 PK STRL BLUE (TOWEL DISPOSABLE) ×2 IMPLANT
TRAY LAPAROSCOPIC (CUSTOM PROCEDURE TRAY) ×2 IMPLANT
TROCAR BALLN 12MMX100 BLUNT (TROCAR) ×2 IMPLANT
TROCAR XCEL NON-BLD 11X100MML (ENDOMECHANICALS) ×2 IMPLANT
TROCAR XCEL NON-BLD 5MMX100MML (ENDOMECHANICALS) ×2 IMPLANT

## 2012-11-18 NOTE — Progress Notes (Signed)
Utilization review completed. Analise Glotfelty, RN, BSN. 

## 2012-11-18 NOTE — Progress Notes (Signed)
Agree with above.  Call with questions

## 2012-11-18 NOTE — Op Note (Signed)
NAME:  Angela [name], Angela [name] NO.:  [id number]  MEDICAL RECORD NO.:  [id number]  LOCATION:  [id number]                        FACILITY:  [facility]  PHYSICIAN:  Lodema Pilot, MD       DATE OF BIRTH:  June 12, 1941  DATE OF PROCEDURE:  11/18/2012 DATE OF DISCHARGE:                              OPERATIVE REPORT   PROCEDURE:  Laparoscopic cholecystectomy with intraoperative cholangiogram.  PREOPERATIVE DIAGNOSIS:  Choledocholithiasis.  POSTOPERATIVE DIAGNOSIS:  Choledocholithiasis.  SURGEON:  Lodema Pilot, MD  ASSISTANT:  None.  ANESTHESIA:  General endotracheal tube anesthesia with 20 mL of 1% lidocaine with epinephrine and 0.25% Marcaine in a 50:50 mixture.  FLUIDS:  900 mL of crystalloid.  ESTIMATED BLOOD LOSS:  Minimal.  DRAINS:  None.  SPECIMENS:  Gallbladder and contents sent to Pathology for permanent sectioning.  COMPLICATIONS:  None apparent.  FINDINGS:  Normal cholangiogram and multiple small gallstones.  Endoloop was placed on the cystic duct stump.  INDICATION FOR PROCEDURE:  Ms. Yarde is a 72 year old female who comes in with choledocholithiasis.  She underwent ERCP with stone retrieval and now needs cholecystectomy to prevent recurrence.  OPERATIVE DETAILS:  Ms. Stockinger was seen and evaluated in the Surgical Ward and risks and benefits of procedure were discussed in lay terms. Informed consent was obtained.  She was given prophylactic antibiotics and taken to the operating room and placed on the table in supine position.  General endotracheal tube anesthesia was obtained and her abdomen was prepped and draped in a standard surgical fashion. Procedure time-out was performed with all operative team members to confirm proper patient and procedure, and a supraumbilical midline incision was made in the skin and dissection carried down to the subcutaneous tissue using blunt dissection.  The fascia was elevated and sharply incised and the peritoneum entered  bluntly.  A 12-mm balloon port was placed at the umbilicus and pneumoperitoneum was obtained. Laparoscope was introduced and two 5-mm right upper quadrant ports were placed under direct visualization and 11-mm epigastric port was placed. The gallbladder was retracted cephalad.  Prior to doing this, she had some adhesions over the liver and these were taken down with sharp dissection.  These were basically adhesions from the liver to the abdominal wall and no bowel.  The gallbladder was then retracted cephalad and she had some edema in the area of the gallbladder, but no cholecystitis.  The peritoneum was taken down bluntly and the triangle of Calot was skeletonized.  Critical view of safety was obtained visualizing a single cystic duct entering the gallbladder and liver parenchyma through the triangle of Calot.  The lymph node of Calot and the cystic artery were seen in its usual anatomic place.  Clips were placed on the artery and the node, but was not divided at this time. Clip was placed on the gallbladder side of the cystic duct stump and the cholangiogram catheter was passed through the abdominal wall through separate stab incision.  Small cystic ductotomy was made and the cholangiogram was performed, which demonstrated free flow of bile into the duodenum and no filling defects in the common bile duct and normal right and left hepatic ducts.  There was delayed filling of the  duct. The catheter was then removed and two clips were placed on the cystic duct stump, but the clips did not completely cover the duct and 0 PDS Endoloop was placed just under the clip for added security.  The duct was completely transected and the previously clipped artery was divided as well and the gallbladder was removed from the gallbladder fossa using Bovie electrocautery.  The gallbladder was not entered during the dissection and there was no spillage of bile.  The gallbladder was completely removed and  placed in an EndoCatch bag and removed from the umbilical trocar site.  She did have some palpable gallstones, this was sent to Pathology for permanent sectioning.  The laparoscope was introduced back in the abdomen and the gallbladder fossa was inspected for hemostasis, which was noted to be adequate.  The right upper quadrant was suctioned and the fluid was noted to be clear.  There was no evidence of bleeding or bowel injury or bile leakage.  The umbilical trocar site was then closed in an open fashion and laparoscope was reintroduced back into the abdomen and the right upper quadrant was noted to be hemostatic with again without any evidence of bleeding, bowel injury or bile.  The abdominal wall closure was noted to be adequate and the final trocars were removed under direct visualization. The abdominal wall was noted to be hemostatic.  The epigastric trocar was removed and the wounds were injected with 20 mL of 1% lidocaine with epinephrine and 0.25% Marcaine in a 50:50 mixture.  Skin edges were approximated with 4-0 Monocryl subcuticular suture.  Skin was washed and dried and Dermabond was applied.  All sponge, needle, and instrument counts were correct at the end of the case.  The patient tolerated the procedure well without apparent complications.          ______________________________ Lodema Pilot, MD     BL/MEDQ  D:  11/18/2012  T:  11/18/2012  Job:  [id number]

## 2012-11-18 NOTE — Brief Op Note (Signed)
11/13/2012 - 11/18/2012  11:14 AM  PATIENT:  Angela [name]  72 y.o. female  PRE-OPERATIVE DIAGNOSIS:  Cholecystitis  POST-OPERATIVE DIAGNOSIS:  Cholecystitis  PROCEDURE:  Procedure(s): LAPAROSCOPIC CHOLECYSTECTOMY WITH INTRAOPERATIVE CHOLANGIOGRAM (N/A)  SURGEON:  Surgeon(s) and Role:    * Lodema Pilot, DO - Primary  PHYSICIAN ASSISTANT:   ASSISTANTS: none   ANESTHESIA:   general  EBL:  Total I/O In: 900 [I.V.:900] Out: 25 [Blood:25]  BLOOD ADMINISTERED:none  DRAINS: none   LOCAL MEDICATIONS USED:  MARCAINE    and LIDOCAINE   SPECIMEN:  Source of Specimen:  gallbladder  DISPOSITION OF SPECIMEN:  PATHOLOGY  COUNTS:  YES  TOURNIQUET:  * No tourniquets in log *  DICTATION: .Other Dictation: Dictation Number dictated  PLAN OF CARE: Admit to inpatient   PATIENT DISPOSITION:  PACU - hemodynamically stable.   Delay start of Pharmacological VTE agent (>24hrs) due to surgical blood loss or risk of bleeding: no

## 2012-11-18 NOTE — Progress Notes (Signed)
[facility]             Progress Note  Narrative Angela [name] is a 72 y.o. female who presents to the ED with complaints of nausea and vomiting for the past 2 weeks worsening over the past 24 hours, with increased RUQ ABD pain 10/10 at the worse and now a 7/10. She denies having fever, diarrhea or constipation or hematemesis. She was evaluated in the ED and found to have elevated LFTs, and Lipase levels and an Ultrasound of the ABD was performed which revealed Multiple Gallstones. She was referred for admission.   Assessment/Plan: Active Problems:   Breast cancer   Arthritis   Glaucoma   Hypercholesterolemia   Nausea and vomiting   Abdominal pain, right upper quadrant   Cholelithiasis   Choledocholithiasis with obstruction   Jaundice  Gallstone Pancreatitis -ERCP with stone removal 3/17 succesful and bili trending down. -Lap chole planned for today, may have had some post-ERCP pancreatitis, now improved. - GI and surgery following.     Subjective: - doing well after the surgery, denies pain  Vital signs in last 24 hours: Temp:  [98.6 F (37 C)-99.6 F (37.6 C)] 98.9 F (37.2 C) (03/19 0800) Pulse Rate:  [71-83] 83 (03/19 0800) Resp:  [16-18] 18 (03/19 0800) BP: (99-126)/(59-73) 99/59 mmHg (03/19 0800) SpO2:  [95 %-100 %] 97 % (03/19 0500) Weight change:  Last BM Date: 11/12/12  Intake/Output from previous day: 03/18 0701 - 03/19 0700 In: 120 [P.O.:120] Out: -     Physical Exam: General: somewhat sleepy, oriented x3 HEENT: No bruits, no goiter. Heart: Regular rate and rhythm, without murmurs, rubs, gallops. Lungs: Clear to auscultation bilaterally. Abdomen: Soft, nontender, nondistended, positive bowel sounds. Extremities: No clubbing cyanosis or edema with positive pedal pulses. Neuro: Grossly intact, nonfocal.  Lab Results: Basic Metabolic Panel:  Recent Labs  40/98/11 0620 11/17/12 0615  NA 142 140  K 3.5 3.5  CL 106 102  CO2 26  27  GLUCOSE 120* 126*  BUN 5* 8  CREATININE 0.78 0.76  CALCIUM 9.2 8.9   Liver Function Tests:  Recent Labs  11/17/12 0615 11/18/12 0509  AST 123* 87*  ALT 281* 224*  ALKPHOS 195* 172*  BILITOT 2.2* 1.8*  PROT 6.0 6.0  ALBUMIN 2.8* 2.8*    Recent Labs  11/17/12 0615 11/18/12 0509  LIPASE 1564* 178*   CBC:  Recent Labs  11/16/12 0620  WBC 2.2*  HGB 12.8  HCT 36.7  MCV 86.8  PLT 165   Urinalysis: No results found for this basename: COLORURINE, APPERANCEUR, LABSPEC, PHURINE, GLUCOSEU, HGBUR, BILIRUBINUR, KETONESUR, PROTEINUR, UROBILINOGEN, NITRITE, LEUKOCYTESUR,  in the last 72 hours  Recent Results (from the past 240 hour(s))  MRSA PCR SCREENING     Status: None   Collection Time    11/15/12  6:38 AM      Result Value Range Status   MRSA by PCR NEGATIVE  NEGATIVE Final   Comment:            The GeneXpert MRSA Assay (FDA     approved for NASAL specimens     only), is one component of a     comprehensive MRSA colonization     surveillance program. It is not     intended to diagnose MRSA     infection nor to guide or     monitor treatment for     MRSA infections.    Studies/Results: Dg Ercp With Sphincterotomy  11/16/2012  *  RADIOLOGY REPORT*  Clinical Data: Cholelithiasis and common bile duct dilation on recent ultrasound.  ERCP WITH SPHINCTEROTOMY  Comparison:  None.  Technique:  Multiple spot images or obtained with the fluoroscopic device and submitted for interpretation post-procedure.  ERCP was performed by Dr. Leone Payor.  Findings: Initial images demonstrate mild dilation of the extrahepatic bile ducts, without intrahepatic dilation. Dr. Leone Payor swept the duct with the balloon catheter.  The completion image shows no filling defects within the duct.  IMPRESSION: No evidence of bile duct stone.  These images were submitted for radiologic interpretation only. Please see the procedural report for the amount of contrast and the fluoroscopy time utilized.    Original Report Authenticated By: Hulan Saas, M.D.     Medications: Scheduled Meds: . anastrozole  1 mg Oral Daily  . loteprednol  2 drop Right Eye BID  . pantoprazole (PROTONIX) IV  40 mg Intravenous Q12H  . timolol  1 drop Right Eye QHS   Continuous Infusions: . sodium chloride 75 mL/hr (11/14/12 2303)  . lactated ringers 20 mL/hr (11/18/12 0612)   PRN Meds:.acetaminophen, acetaminophen, HYDROmorphone (DILAUDID) injection, ondansetron (ZOFRAN) IV, ondansetron  Time spent coordinating care: 15 minutes.   LOS: 5 days   Pamella Pert [facility] Pager: [phone] 11/18/2012, 8:57 AM

## 2012-11-18 NOTE — Progress Notes (Signed)
2 Days Post-Op  Subjective: Stomach feeling better.  Hungry  Objective: Vital signs in last 24 hours: Temp:  [98.6 F (37 C)-99.6 F (37.6 C)] 99.6 F (37.6 C) (03/19 0500) Pulse Rate:  [71-75] 75 (03/19 0500) Resp:  [16-18] 16 (03/19 0500) BP: (102-126)/(63-73) 109/68 mmHg (03/19 0500) SpO2:  [95 %-100 %] 97 % (03/19 0500) Last BM Date: 11/12/12  Intake/Output from previous day: 03/18 0701 - 03/19 0700 In: 120 [P.O.:120] Out: -  Intake/Output this shift:    General appearance: alert, cooperative and no distress GI: soft, NT this am, ND, no peritoneal signs  Lab Results:   Recent Labs  11/16/12 0620  WBC 2.2*  HGB 12.8  HCT 36.7  PLT 165   BMET  Recent Labs  11/16/12 0620 11/17/12 0615  NA 142 140  K 3.5 3.5  CL 106 102  CO2 26 27  GLUCOSE 120* 126*  BUN 5* 8  CREATININE 0.78 0.76  CALCIUM 9.2 8.9   PT/INR No results found for this basename: LABPROT, INR,  in the last 72 hours ABG No results found for this basename: PHART, PCO2, PO2, HCO3,  in the last 72 hours  Studies/Results: Dg Ercp With Sphincterotomy  11/16/2012  *RADIOLOGY REPORT*  Clinical Data: Cholelithiasis and common bile duct dilation on recent ultrasound.  ERCP WITH SPHINCTEROTOMY  Comparison:  None.  Technique:  Multiple spot images or obtained with the fluoroscopic device and submitted for interpretation post-procedure.  ERCP was performed by Dr. Leone Payor.  Findings: Initial images demonstrate mild dilation of the extrahepatic bile ducts, without intrahepatic dilation. Dr. Leone Payor swept the duct with the balloon catheter.  The completion image shows no filling defects within the duct.  IMPRESSION: No evidence of bile duct stone.  These images were submitted for radiologic interpretation only. Please see the procedural report for the amount of contrast and the fluoroscopy time utilized.   Original Report Authenticated By: Hulan Saas, M.D.     Anti-infectives: Anti-infectives   Start      Dose/Rate Route Frequency Ordered Stop   11/16/12 1430  ampicillin-sulbactam (UNASYN) 1.5 g in sodium chloride 0.9 % 50 mL IVPB  Status:  Discontinued     1.5 g 100 mL/hr over 30 Minutes Intravenous  Once 11/16/12 1424 11/16/12 1631   11/16/12 1400  ampicillin-sulbactam (UNASYN) 1.5 g in sodium chloride 0.9 % 50 mL IVPB  Status:  Discontinued     1.5 g 100 mL/hr over 30 Minutes Intravenous  Once 11/16/12 1311 11/16/12 1424      Assessment/Plan: s/p Procedure(s): ENDOSCOPIC RETROGRADE CHOLANGIOPANCREATOGRAPHY (ERCP) (N/A) she feels better this am, stomach pain and lipase improved, she should be okay for lap chole today.  I have again discussed with her the pros and cons and risks of the procedure and all questions answered.  she desires to proceed with lap chole/IOC  LOS: 5 days    Lodema Pilot DAVID 11/18/2012

## 2012-11-18 NOTE — Preoperative (Signed)
Beta Blockers   Reason not to administer Beta Blockers:Not Applicable 

## 2012-11-18 NOTE — Anesthesia Preprocedure Evaluation (Signed)
Anesthesia Evaluation  Patient identified by MRN, date of birth, ID band Patient awake    Reviewed: Allergy & Precautions, H&P , NPO status , Patient's Chart, lab work & pertinent test results  Airway Mallampati: II  Neck ROM: full    Dental   Pulmonary          Cardiovascular  Mitral valve prolapse   Neuro/Psych  Neuromuscular disease    GI/Hepatic GERD-  ,  Endo/Other    Renal/GU      Musculoskeletal  (+) Arthritis -,   Abdominal   Peds  Hematology   Anesthesia Other Findings   Reproductive/Obstetrics                           Anesthesia Physical Anesthesia Plan  ASA: II  Anesthesia Plan: General   Post-op Pain Management:    Induction: Intravenous  Airway Management Planned: Oral ETT  Additional Equipment:   Intra-op Plan:   Post-operative Plan: Extubation in OR  Informed Consent: I have reviewed the patients History and Physical, chart, labs and discussed the procedure including the risks, benefits and alternatives for the proposed anesthesia with the patient or authorized representative who has indicated his/her understanding and acceptance.     Plan Discussed with: CRNA and Surgeon  Anesthesia Plan Comments:         Anesthesia Quick Evaluation

## 2012-11-18 NOTE — Transfer of Care (Signed)
Immediate Anesthesia Transfer of Care Note  Patient: Angela [name]  Procedure(s) Performed: Procedure(s): LAPAROSCOPIC CHOLECYSTECTOMY WITH INTRAOPERATIVE CHOLANGIOGRAM (N/A)  Patient Location: PACU  Anesthesia Type:General  Level of Consciousness: awake, alert  and oriented  Airway & Oxygen Therapy: Patient Spontanous Breathing and Patient connected to nasal cannula oxygen  Post-op Assessment: Report given to PACU RN, Post -op Vital signs reviewed and stable and Patient moving all extremities  Post vital signs: Reviewed and stable  Complications: No apparent anesthesia complications

## 2012-11-18 NOTE — Anesthesia Postprocedure Evaluation (Signed)
Anesthesia Post Note  Patient: Angela [name]  Procedure(s) Performed: Procedure(s) (LRB): LAPAROSCOPIC CHOLECYSTECTOMY WITH INTRAOPERATIVE CHOLANGIOGRAM (N/A)  Anesthesia type: General  Patient location: PACU  Post pain: Pain level controlled and Adequate analgesia  Post assessment: Post-op Vital signs reviewed, Patient's Cardiovascular Status Stable, Respiratory Function Stable, Patent Airway and Pain level controlled  Last Vitals:  Filed Vitals:   11/18/12 1200  BP: 124/73  Pulse: 70  Temp: 37 C  Resp: 20    Post vital signs: Reviewed and stable  Level of consciousness: awake, alert  and oriented  Complications: No apparent anesthesia complications

## 2012-11-18 NOTE — Progress Notes (Signed)
Note Lipase of 1564 yest, down to 126 today.  Pain improved and hungry per Dr Delice Lesch note.  He has taken her to lap chole today.  GI available PRN  Jennye Moccasin PA-C [phone]

## 2012-11-19 ENCOUNTER — Encounter (HOSPITAL_COMMUNITY): Payer: Self-pay | Admitting: General Surgery

## 2012-11-19 DIAGNOSIS — K861 Other chronic pancreatitis: Secondary | ICD-10-CM

## 2012-11-19 LAB — BASIC METABOLIC PANEL
CO2: 28 mEq/L (ref 19–32)
Chloride: 102 mEq/L (ref 96–112)
Creatinine, Ser: 0.71 mg/dL (ref 0.50–1.10)
Potassium: 3.5 mEq/L (ref 3.5–5.1)

## 2012-11-19 LAB — CBC
HCT: 36.9 % (ref 36.0–46.0)
MCV: 87.4 fL (ref 78.0–100.0)
Platelets: 170 10*3/uL (ref 150–400)
RBC: 4.22 MIL/uL (ref 3.87–5.11)
WBC: 4 10*3/uL (ref 4.0–10.5)

## 2012-11-19 MED ORDER — PANTOPRAZOLE SODIUM 40 MG PO TBEC
40.0000 mg | DELAYED_RELEASE_TABLET | Freq: Two times a day (BID) | ORAL | Status: DC
  Administered 2012-11-19 – 2012-11-20 (×3): 40 mg via ORAL
  Filled 2012-11-19 (×3): qty 1

## 2012-11-19 NOTE — Progress Notes (Addendum)
[facility]             Progress Note  Narrative Angela [name] is a 72 y.o. female who presents to the ED with complaints of nausea and vomiting for the past 2 weeks worsening over the past 24 hours, with increased RUQ ABD pain 10/10 at the worse and now a 7/10. She denies having fever, diarrhea or constipation or hematemesis. She was evaluated in the ED and found to have elevated LFTs, and Lipase levels and an Ultrasound of the ABD was performed which revealed Multiple Gallstones. She was referred for admission.   Assessment/Plan: Active Problems:   Breast cancer   Arthritis   Glaucoma   Hypercholesterolemia   Nausea and vomiting   Abdominal pain, right upper quadrant   Cholelithiasis   Choledocholithiasis with obstruction   Jaundice  Gallstone Pancreatitis - ERCP with stone removal 3/17 succesful and bili trending down. - s/p lap chole 3/19 - d/c 3/21 in am  Subjective: - doing well after the surgery, denies pain  Vital signs in last 24 hours: Temp:  [97.7 F (36.5 C)-98.9 F (37.2 C)] 97.7 F (36.5 C) (03/20 0530) Pulse Rate:  [67-87] 71 (03/20 0530) Resp:  [18-25] 18 (03/20 0530) BP: (99-131)/(58-81) 116/81 mmHg (03/20 0530) SpO2:  [97 %-100 %] 98 % (03/20 0530) Weight change:  Last BM Date: 11/18/12  Intake/Output from previous day: 03/19 0701 - 03/20 0700 In: 900 [I.V.:900] Out: 25 [Blood:25]    Physical Exam: General: NAD HEENT: No bruits, no goiter. Heart: Regular rate and rhythm, without murmurs, rubs, gallops. Lungs: Clear to auscultation bilaterally. Abdomen: Soft, nontender, nondistended, positive bowel sounds. Extremities: No clubbing cyanosis or edema with positive pedal pulses. Neuro: Grossly intact, nonfocal.  Lab Results: Basic Metabolic Panel:  Recent Labs  16/10/96 0615 11/19/12 0610  NA 140 139  K 3.5 3.5  CL 102 102  CO2 27 28  GLUCOSE 126* 110*  BUN 8 7  CREATININE 0.76 0.71  CALCIUM 8.9 8.9   Liver Function  Tests:  Recent Labs  11/17/12 0615 11/18/12 0509  AST 123* 87*  ALT 281* 224*  ALKPHOS 195* 172*  BILITOT 2.2* 1.8*  PROT 6.0 6.0  ALBUMIN 2.8* 2.8*    Recent Labs  11/17/12 0615 11/18/12 0509  LIPASE 1564* 178*   CBC:  Recent Labs  11/19/12 0610  WBC 4.0  HGB 12.8  HCT 36.9  MCV 87.4  PLT 170   Urinalysis: No results found for this basename: COLORURINE, APPERANCEUR, LABSPEC, PHURINE, GLUCOSEU, HGBUR, BILIRUBINUR, KETONESUR, PROTEINUR, UROBILINOGEN, NITRITE, LEUKOCYTESUR,  in the last 72 hours  Recent Results (from the past 240 hour(s))  MRSA PCR SCREENING     Status: None   Collection Time    11/15/12  6:38 AM      Result Value Range Status   MRSA by PCR NEGATIVE  NEGATIVE Final   Comment:            The GeneXpert MRSA Assay (FDA     approved for NASAL specimens     only), is one component of a     comprehensive MRSA colonization     surveillance program. It is not     intended to diagnose MRSA     infection nor to guide or     monitor treatment for     MRSA infections.    Studies/Results: Dg Cholangiogram Operative  11/18/2012  *RADIOLOGY REPORT*  Clinical Data:   Gallstones.  INTRAOPERATIVE CHOLANGIOGRAM  Technique:  Cholangiographic images from the C-arm fluoroscopic device were submitted for interpretation post-operatively.  Please see the procedural report for the amount of contrast and the fluoroscopy time utilized.  Comparison:  Ultrasound 11/13/2012.  ERCP 11/16/2012.  Findings:  The gallbladder has been removed and the cystic duct cannulated.  Contrast injection demonstrates no filling defects within the common hepatic or common bile duct.  Common bile duct does appear mildly increased in size consistent with prior imaging studies.  There is patency of the ampulla with passage of contrast into the duodenum, although mild stricture or spasm is not excluded.  IMPRESSION: No retained common duct stones are seen.  Dilated common bile duct without frank  obstruction.   Original Report Authenticated By: Davonna Belling, M.D.     Medications: Scheduled Meds: . anastrozole  1 mg Oral Daily  . loteprednol  2 drop Right Eye BID  . pantoprazole (PROTONIX) IV  40 mg Intravenous Q12H  . timolol  1 drop Right Eye QHS   Continuous Infusions: . sodium chloride 75 mL/hr (11/14/12 2303)  . lactated ringers 20 mL/hr (11/18/12 0612)   PRN Meds:.acetaminophen, acetaminophen, HYDROcodone-acetaminophen, HYDROmorphone (DILAUDID) injection, ondansetron (ZOFRAN) IV, ondansetron  Time spent coordinating care: 15 minutes.   LOS: 6 days   Pamella Pert [facility] Pager: [phone] 11/19/2012, 7:33 AM

## 2012-11-19 NOTE — Progress Notes (Signed)
Patient ID: Angela [name], female   DOB: 1941/03/30, 72 y.o.   MRN: [id number] 1 Day Post-Op  Subjective: Pt feels well this morning.  Having some pain, but well controlled.  Objective: Vital signs in last 24 hours: Temp:  [97.7 F (36.5 C)-98.6 F (37 C)] 97.7 F (36.5 C) (03/20 0530) Pulse Rate:  [67-87] 71 (03/20 0530) Resp:  [18-25] 18 (03/20 0530) BP: (106-131)/(58-81) 116/81 mmHg (03/20 0530) SpO2:  [97 %-100 %] 98 % (03/20 0530) Last BM Date: 11/19/12  Intake/Output from previous day: 03/19 0701 - 03/20 0700 In: 900 [I.V.:900] Out: 25 [Blood:25] Intake/Output this shift:    PE: Abd: soft, appropriately tender, +BS, ND, incisions c/d/i with dermabond  Lab Results:   Recent Labs  11/19/12 0610  WBC 4.0  HGB 12.8  HCT 36.9  PLT 170   BMET  Recent Labs  11/17/12 0615 11/19/12 0610  NA 140 139  K 3.5 3.5  CL 102 102  CO2 27 28  GLUCOSE 126* 110*  BUN 8 7  CREATININE 0.76 0.71  CALCIUM 8.9 8.9   PT/INR No results found for this basename: LABPROT, INR,  in the last 72 hours CMP     Component Value Date/Time   NA 139 11/19/2012 0610   NA 141 06/16/2012 1333   K 3.5 11/19/2012 0610   K 3.4* 06/16/2012 1333   CL 102 11/19/2012 0610   CL 107 06/16/2012 1333   CO2 28 11/19/2012 0610   CO2 23 06/16/2012 1333   GLUCOSE 110* 11/19/2012 0610   GLUCOSE 133* 06/16/2012 1333   BUN 7 11/19/2012 0610   BUN 14.0 06/16/2012 1333   CREATININE 0.71 11/19/2012 0610   CREATININE 0.7 06/16/2012 1333   CALCIUM 8.9 11/19/2012 0610   CALCIUM 9.5 06/16/2012 1333   PROT 6.0 11/18/2012 0509   PROT 6.3* 06/16/2012 1333   ALBUMIN 2.8* 11/18/2012 0509   ALBUMIN 3.4* 06/16/2012 1333   AST 87* 11/18/2012 0509   AST 16 06/16/2012 1333   ALT 224* 11/18/2012 0509   ALT 16 06/16/2012 1333   ALKPHOS 172* 11/18/2012 0509   ALKPHOS 87 06/16/2012 1333   BILITOT 1.8* 11/18/2012 0509   BILITOT 0.30 06/16/2012 1333   GFRNONAA 85* 11/19/2012 0610   GFRAA >90 11/19/2012 0610   Lipase    Component Value Date/Time   LIPASE 178* 11/18/2012 0509       Studies/Results: Dg Cholangiogram Operative  11/18/2012  *RADIOLOGY REPORT*  Clinical Data:   Gallstones.  INTRAOPERATIVE CHOLANGIOGRAM  Technique:  Cholangiographic images from the C-arm fluoroscopic device were submitted for interpretation post-operatively.  Please see the procedural report for the amount of contrast and the fluoroscopy time utilized.  Comparison:  Ultrasound 11/13/2012.  ERCP 11/16/2012.  Findings:  The gallbladder has been removed and the cystic duct cannulated.  Contrast injection demonstrates no filling defects within the common hepatic or common bile duct.  Common bile duct does appear mildly increased in size consistent with prior imaging studies.  There is patency of the ampulla with passage of contrast into the duodenum, although mild stricture or spasm is not excluded.  IMPRESSION: No retained common duct stones are seen.  Dilated common bile duct without frank obstruction.   Original Report Authenticated By: Davonna Belling, M.D.     Anti-infectives: Anti-infectives   Start     Dose/Rate Route Frequency Ordered Stop   11/18/12 0930  ceFAZolin (ANCEF) IVPB 2 g/50 mL premix     2 g 100 mL/hr over 30  Minutes Intravenous  Once 11/18/12 0927 11/18/12 0944   11/16/12 1430  ampicillin-sulbactam (UNASYN) 1.5 g in sodium chloride 0.9 % 50 mL IVPB  Status:  Discontinued     1.5 g 100 mL/hr over 30 Minutes Intravenous  Once 11/16/12 1424 11/16/12 1631   11/16/12 1400  ampicillin-sulbactam (UNASYN) 1.5 g in sodium chloride 0.9 % 50 mL IVPB  Status:  Discontinued     1.5 g 100 mL/hr over 30 Minutes Intravenous  Once 11/16/12 1311 11/16/12 1424       Assessment/Plan  1. Choledocholithiasis, s/p ERCP, s/p lap chole  Plan: 1. Ok for [employer] home today if she tolerates her solid diet for lunch. 2. She had follow up already arranged with [place] and dc instructions provided.   LOS: 6 days    OSBORNE,KELLY E 11/19/2012,  9:16 AM Pager: [phone] Doing fine.  No evidence of postop complication.  Diet and activity as tolerated. Follow up in about 3 weeks with [facility].

## 2012-11-20 MED ORDER — HYDROCODONE-ACETAMINOPHEN 5-325 MG PO TABS: 2.0000 | ORAL_TABLET | Freq: Four times a day (QID) | ORAL | Status: DC | PRN

## 2012-11-20 NOTE — Progress Notes (Signed)
Discussed discharge instructions with patient. Prescriptions given to patient. All questions answered.  Discharged via wheelchair with [facility] services

## 2012-11-20 NOTE — Progress Notes (Signed)
Patient ID: Angela [name], female   DOB: 1940/12/01, 72 y.o.   MRN: [id number] 2 Days Post-Op  Subjective: Pt doing well.  Pain controlled.  Tolerating a diet  Objective: Vital signs in last 24 hours: Temp:  [97.5 F (36.4 C)-98.4 F (36.9 C)] 98.4 F (36.9 C) (03/21 0559) Pulse Rate:  [78-91] 78 (03/21 0559) Resp:  [18] 18 (03/21 0559) BP: (115-132)/(61-71) 132/61 mmHg (03/21 0559) SpO2:  [96 %-98 %] 98 % (03/21 0559) Weight:  [208 lb (94.348 kg)] 208 lb (94.348 kg) (03/20 1500) Last BM Date: 11/20/12  Intake/Output from previous day: 03/20 0701 - 03/21 0700 In: 720 [P.O.:720] Out: -  Intake/Output this shift: Total I/O In: 240 [P.O.:240] Out: -   PE: Abd: soft, appropriately tender, +BS, incisions c/d/i  Lab Results:   Recent Labs  11/19/12 0610  WBC 4.0  HGB 12.8  HCT 36.9  PLT 170   BMET  Recent Labs  11/19/12 0610  NA 139  K 3.5  CL 102  CO2 28  GLUCOSE 110*  BUN 7  CREATININE 0.71  CALCIUM 8.9   PT/INR No results found for this basename: LABPROT, INR,  in the last 72 hours CMP     Component Value Date/Time   NA 139 11/19/2012 0610   NA 141 06/16/2012 1333   K 3.5 11/19/2012 0610   K 3.4* 06/16/2012 1333   CL 102 11/19/2012 0610   CL 107 06/16/2012 1333   CO2 28 11/19/2012 0610   CO2 23 06/16/2012 1333   GLUCOSE 110* 11/19/2012 0610   GLUCOSE 133* 06/16/2012 1333   BUN 7 11/19/2012 0610   BUN 14.0 06/16/2012 1333   CREATININE 0.71 11/19/2012 0610   CREATININE 0.7 06/16/2012 1333   CALCIUM 8.9 11/19/2012 0610   CALCIUM 9.5 06/16/2012 1333   PROT 6.0 11/18/2012 0509   PROT 6.3* 06/16/2012 1333   ALBUMIN 2.8* 11/18/2012 0509   ALBUMIN 3.4* 06/16/2012 1333   AST 87* 11/18/2012 0509   AST 16 06/16/2012 1333   ALT 224* 11/18/2012 0509   ALT 16 06/16/2012 1333   ALKPHOS 172* 11/18/2012 0509   ALKPHOS 87 06/16/2012 1333   BILITOT 1.8* 11/18/2012 0509   BILITOT 0.30 06/16/2012 1333   GFRNONAA 85* 11/19/2012 0610   GFRAA >90 11/19/2012 0610    Lipase     Component Value Date/Time   LIPASE 178* 11/18/2012 0509       Studies/Results: No results found.  Anti-infectives: Anti-infectives   Start     Dose/Rate Route Frequency Ordered Stop   11/18/12 0930  ceFAZolin (ANCEF) IVPB 2 g/50 mL premix     2 g 100 mL/hr over 30 Minutes Intravenous  Once 11/18/12 0927 11/18/12 0944   11/16/12 1430  ampicillin-sulbactam (UNASYN) 1.5 g in sodium chloride 0.9 % 50 mL IVPB  Status:  Discontinued     1.5 g 100 mL/hr over 30 Minutes Intravenous  Once 11/16/12 1424 11/16/12 1631   11/16/12 1400  ampicillin-sulbactam (UNASYN) 1.5 g in sodium chloride 0.9 % 50 mL IVPB  Status:  Discontinued     1.5 g 100 mL/hr over 30 Minutes Intravenous  Once 11/16/12 1311 11/16/12 1424       Assessment/Plan  1. S/p lap chole  Plan: 1. Ok for [employer] home from our standpoint.  Follow up already arranged   LOS: 7 days    Kamarii Carton E 11/20/2012, 10:51 AM Pager: [phone]

## 2012-11-20 NOTE — Discharge Summary (Signed)
Physician Discharge Summary  Angela [name] ZOX:[id number] DOB: 1940-12-09 DOA: 11/13/2012  PCP: Ron Parker, MD  Admit date: 11/13/2012 Discharge date: 11/20/2012  Time spent:  40 minutes  Recommendations for Outpatient Follow-up:  1. Home with outpt PCP follow up. Monitor LFTs as outpatient  Discharge Diagnoses:  Principal Problem:   Choledocholithiasis with obstruction  Active Problems:   Cholelithiasis   Breast cancer   Arthritis   Glaucoma   Hypercholesterolemia   Nausea and vomiting   Abdominal pain, right upper quadrant   Jaundice   Discharge Condition: fair  Diet recommendation: low cholesterol  Filed Weights   11/19/12 1500  Weight: 94.348 kg (208 lb)    History of present illness:  Please refer to admission H &P for details but in brief, 72 y.o. female who presents to the ED with complaints of nausea and vomiting for the past 2 weeks worsening over the past 24 hours, with increased RUQ ABD pain 10/10 at the worse and now a 7/10. She denied having fever, diarrhea or constipation or hematemesis. She was evaluated in the ED and found to have elevated LFTs, and Lipase levels and an Ultrasound of the ABD was performed which revealed Multiple Gallstones.    Hospital Course:  Gallstone Pancreatitis  -ERCP with stone removal 3/17 succesful and bili trending down.  -Lap chole  Done on 319 . Tolerated procedure well. Elevated lipase now trended down . LFTs elevated but stable. Needs follow up as outpt  patient clinically stable and tolerating regular diet . Will be discharged home with outpt PCP and surgery follow up.  - GI and surgery following.    Procedures:  ERCP   lap choly on 3/19  Consultations:  [facility]  [facility]  Discharge Exam: Filed Vitals:   11/19/12 1347 11/19/12 1500 11/19/12 2200 11/20/12 0559  BP: 115/70  121/71 132/61  Pulse: 91  84 78  Temp: 97.5 F (36.4 C)  98.4 F (36.9 C) 98.4 F (36.9 C)  TempSrc: Oral  Oral Oral   Resp: 18  18 18   Height:  5\' 5"  (1.651 m)    Weight:  94.348 kg (208 lb)    SpO2: 96%  97% 98%    General: elderly female in NAD HEENT: no pallor, icteric, moist oral mucosa Chest: clear b/l,mno added sounds Cardiovascular: NS1&s2, No murmurs Abd: laparoscopic site intact, some RUQ tenderness, BS+ Ext: warm, no edema  CNS: AAOX3  Discharge Instructions   Future Appointments Provider Department Dept Phone   12/08/2012 11:30 AM [facility] [place] [phone]   12/11/2012 11:00 AM [facility] [phone]   12/14/2012 10:00 AM Exie Parody, MD [facility] [phone]       Medication List    TAKE these medications       anastrozole 1 MG tablet  Commonly known as:  ARIMIDEX  Take 1 mg by mouth daily.     CALCIUM + D PO  Take 1 tablet by mouth 2 (two) times a week. Days of the week varies - holds for loose stools     fish oil-omega-3 fatty acids 1000 MG capsule  Take 1 capsule by mouth daily. Fish oil 1200 mg + Vit D 1000  Holds for loose stools     HYDROcodone-acetaminophen 5-325 MG per tablet  Commonly known as:  NORCO/VICODIN  Take 2 tablets by mouth every 6 (six) hours as needed.  LOTEMAX 0.5 % ophthalmic suspension  Generic drug:  loteprednol  Place 2 drops into the right eye 2 (two) times daily.     multivitamin with minerals Tabs  Take 1 tablet by mouth daily.     omeprazole 20 MG capsule  Commonly known as:  PRILOSEC  Take 20 mg by mouth daily.     timolol 0.5 % ophthalmic solution  Commonly known as:  TIMOPTIC  Place 1 drop into the right eye every evening.     traMADol 50 MG tablet  Commonly known as:  ULTRAM  Take 50 mg by mouth daily as needed for pain.           Follow-up Information   Follow up with [facility] On 12/08/2012. (11:15am, arrive at 10:45am)    Contact information:   [address]    [place] [address] [phone]       Follow up with Ron Parker, MD In 1 week.   Contact information:   [address] [place] [address] [phone]        The results of significant diagnostics from this hospitalization (including imaging, microbiology, ancillary and laboratory) are listed below for reference.    Significant Diagnostic Studies: Dg Cholangiogram Operative  11/18/2012  *RADIOLOGY REPORT*  Clinical Data:   Gallstones.  INTRAOPERATIVE CHOLANGIOGRAM  Technique:  Cholangiographic images from the C-arm fluoroscopic device were submitted for interpretation post-operatively.  Please see the procedural report for the amount of contrast and the fluoroscopy time utilized.  Comparison:  Ultrasound 11/13/2012.  ERCP 11/16/2012.  Findings:  The gallbladder has been removed and the cystic duct cannulated.  Contrast injection demonstrates no filling defects within the common hepatic or common bile duct.  Common bile duct does appear mildly increased in size consistent with prior imaging studies.  There is patency of the ampulla with passage of contrast into the duodenum, although mild stricture or spasm is not excluded.  IMPRESSION: No retained common duct stones are seen.  Dilated common bile duct without frank obstruction.   Original Report Authenticated By: Davonna Belling, M.D.    US Abdomen Complete  11/13/2012  *RADIOLOGY REPORT*  Clinical Data:  Abdominal pain.  Rule out gallstones.  Vomiting. Recent kidney infection.  COMPLETE ABDOMINAL ULTRASOUND  Comparison:  CT of the abdomen and pelvis 04/30/2007  Findings:  Gallbladder:  Within the gallbladder, multiple small mobile stones are identified, measuring 6 mm in diameter and smaller. Gallbladder wall is upper limits normal in thickness, 3.6 mm.  No pericholecystic fluid identified.  No sonographic Murphy's sign.  Common bile duct:  Dilated, 11.3 mm.  Distal duct is not well seen.  Liver:  Echogenic without focal  mass.  IVC:  Appears normal.  Pancreas:  Pancreatic duct is mildly prominent at 2.3 mm.  Spleen:  5.6 cm, normal in appearance.  Right Kidney:  10.8 cm, normal in appearance.  Left Kidney:  9.9 cm.  Simple cyst is 2.5 x 2.4 x 2.4 cm in mid pole region.  Abdominal aorta:  Not aneurysmal, 2.7 cm maximum diameter.  IMPRESSION:  1.  Multiple mobile gallstones. 2.  Dilated common bile duct raises the question of distal duct stones or other obstruction.  Distal duct is not well seen however.Consider further evaluation with CT of the abdomen with contrast. 3.  Left renal cyst.   Original Report Authenticated By: Norva Pavlov, M.D.    Dg Ercp With Sphincterotomy  11/16/2012  *RADIOLOGY  REPORT*  Clinical Data: Cholelithiasis and common bile duct dilation on recent ultrasound.  ERCP WITH SPHINCTEROTOMY  Comparison:  None.  Technique:  Multiple spot images or obtained with the fluoroscopic device and submitted for interpretation post-procedure.  ERCP was performed by Dr. Leone Payor.  Findings: Initial images demonstrate mild dilation of the extrahepatic bile ducts, without intrahepatic dilation. Dr. Leone Payor swept the duct with the balloon catheter.  The completion image shows no filling defects within the duct.  IMPRESSION: No evidence of bile duct stone.  These images were submitted for radiologic interpretation only. Please see the procedural report for the amount of contrast and the fluoroscopy time utilized.   Original Report Authenticated By: Hulan Saas, M.D.     Microbiology: Recent Results (from the past 240 hour(s))  MRSA PCR SCREENING     Status: None   Collection Time    11/15/12  6:38 AM      Result Value Range Status   MRSA by PCR NEGATIVE  NEGATIVE Final   Comment:            The GeneXpert MRSA Assay (FDA     approved for NASAL specimens     only), is one component of a     comprehensive MRSA colonization     surveillance program. It is not     intended to diagnose MRSA     infection nor  to guide or     monitor treatment for     MRSA infections.     Labs: Basic Metabolic Panel:  Recent Labs Lab 11/13/12 1714 11/14/12 0624 11/16/12 0620 11/17/12 0615 11/19/12 0610  NA 140 144 142 140 139  K 3.4* 3.7 3.5 3.5 3.5  CL 99 108 106 102 102  CO2 29 27 26 27 28   GLUCOSE 124* 112* 120* 126* 110*  BUN 8 7 5* 8 7  CREATININE 0.67 0.77 0.78 0.76 0.71  CALCIUM 9.6 8.8 9.2 8.9 8.9   Liver Function Tests:  Recent Labs Lab 11/13/12 1714 11/15/12 0857 11/16/12 0620 11/17/12 0615 11/18/12 0509  AST 670* 232* 179* 123* 87*  ALT 477* 382* 320* 281* 224*  ALKPHOS 142* 193* 199* 195* 172*  BILITOT 2.7* 5.7* 6.1* 2.2* 1.8*  PROT 7.4 6.3 6.1 6.0 6.0  ALBUMIN 3.9 3.1* 2.9* 2.8* 2.8*    Recent Labs Lab 11/13/12 1747 11/17/12 0615 11/18/12 0509  LIPASE 81* 1564* 178*   No results found for this basename: AMMONIA,  in the last 168 hours CBC:  Recent Labs Lab 11/13/12 1714 11/14/12 0624 11/16/12 0620 11/19/12 0610  WBC 3.5* 2.4* 2.2* 4.0  HGB 14.9 13.0 12.8 12.8  HCT 41.9 38.3 36.7 36.9  MCV 85.7 89.1 86.8 87.4  PLT 220 183 165 170   Cardiac Enzymes: No results found for this basename: CKTOTAL, CKMB, CKMBINDEX, TROPONINI,  in the last 168 hours BNP: BNP (last 3 results) No results found for this basename: PROBNP,  in the last 8760 hours CBG: No results found for this basename: GLUCAP,  in the last 168 hours     Signed:  Eddie North  [facility] 11/20/2012, 10:32 AM

## 2012-12-08 ENCOUNTER — Encounter (INDEPENDENT_AMBULATORY_CARE_PROVIDER_SITE_OTHER): Payer: Self-pay

## 2012-12-08 ENCOUNTER — Ambulatory Visit (INDEPENDENT_AMBULATORY_CARE_PROVIDER_SITE_OTHER): Payer: Medicare Other | Admitting: General Surgery

## 2012-12-08 VITALS — BP 122/84 | HR 72 | Temp 97.0°F | Resp 18 | Ht 65.0 in | Wt 203.8 lb

## 2012-12-08 DIAGNOSIS — K8020 Calculus of gallbladder without cholecystitis without obstruction: Secondary | ICD-10-CM

## 2012-12-08 DIAGNOSIS — K8051 Calculus of bile duct without cholangitis or cholecystitis with obstruction: Secondary | ICD-10-CM

## 2012-12-08 DIAGNOSIS — R17 Unspecified jaundice: Secondary | ICD-10-CM

## 2012-12-08 NOTE — Patient Instructions (Signed)
She may resume a regular diet and full activity.  She may follow-up on a PRN basis. 

## 2012-12-08 NOTE — Progress Notes (Signed)
  Subjective: Angela [name] is a 72 y.o. female who had a laparoscopic cholecystectomy  on 11/18/12 by Dr. Biagio Quint  returns to the clinic today.  Pathology reveals mild chronic cholecystitis and cholelithiasis, no tumor seen.  The patient is tolerating their diet well and is having no severe pain.  Bowel function is good.  The pre-operative symptoms of abdominal pain, nausea, and vomiting have resolved.  No problems with the wounds.  Pt is returning to normal activity.   Objective: Vital signs in last 24 hours: Reviewed   PE: General:  Alert, NAD, pleasant Abdomen:  soft, NT/ND, +bs, incisions appear well-healed with no sign of infection or bleeding   Assessment/Plan  1.  S/P Laparoscopic Cholecystectomy: doing well, may resume regular activity without restrictions, Pt will follow up with [place] PRN and knows to call with questions or concerns.      Aris [place], PA-C 12/08/2012

## 2012-12-11 ENCOUNTER — Other Ambulatory Visit: Payer: Medicare Other | Admitting: Lab

## 2012-12-11 ENCOUNTER — Other Ambulatory Visit (HOSPITAL_BASED_OUTPATIENT_CLINIC_OR_DEPARTMENT_OTHER): Payer: Medicare Other | Admitting: Lab

## 2012-12-11 DIAGNOSIS — C50919 Malignant neoplasm of unspecified site of unspecified female breast: Secondary | ICD-10-CM

## 2012-12-11 LAB — COMPREHENSIVE METABOLIC PANEL (CC13)
BUN: 11.8 mg/dL (ref 7.0–26.0)
CO2: 27 mEq/L (ref 22–29)
Calcium: 9.4 mg/dL (ref 8.4–10.4)
Chloride: 107 mEq/L (ref 98–107)
Creatinine: 0.8 mg/dL (ref 0.6–1.1)
Glucose: 117 mg/dl — ABNORMAL HIGH (ref 70–99)
Total Bilirubin: 0.57 mg/dL (ref 0.20–1.20)

## 2012-12-11 LAB — CBC WITH DIFFERENTIAL/PLATELET
BASO%: 0.6 % (ref 0.0–2.0)
Basophils Absolute: 0 10*3/uL (ref 0.0–0.1)
Eosinophils Absolute: 0.1 10*3/uL (ref 0.0–0.5)
HCT: 39.8 % (ref 34.8–46.6)
HGB: 12.9 g/dL (ref 11.6–15.9)
LYMPH%: 47.6 % (ref 14.0–49.7)
MCHC: 32.5 g/dL (ref 31.5–36.0)
MONO#: 0.3 10*3/uL (ref 0.1–0.9)
NEUT%: 38.1 % — ABNORMAL LOW (ref 38.4–76.8)
Platelets: 199 10*3/uL (ref 145–400)
WBC: 3.4 10*3/uL — ABNORMAL LOW (ref 3.9–10.3)
lymph#: 1.6 10*3/uL (ref 0.9–3.3)

## 2012-12-11 LAB — LACTATE DEHYDROGENASE (CC13): LDH: 160 U/L (ref 125–245)

## 2012-12-12 LAB — CANCER ANTIGEN 27.29: CA 27.29: 23 U/mL (ref 0–39)

## 2012-12-13 NOTE — Patient Instructions (Addendum)
1.  Diagnosis:  History of breast cancer. 2.  Treatment:  Finishing up 5 years of anastrazole (aka Arimidex) 3.  Recommendation:   *  Continue anastrazole since there is data supporting the use of Tamoxifen for 10 years over 5 years.  *  Another option is to go off of all hormonal therapy and start observation with yearly mammogram.  4.  Follow up:  Mammogram in 04/2013 and return visit in 06/2013.

## 2012-12-14 ENCOUNTER — Encounter: Payer: Medicare Other | Admitting: *Deleted

## 2012-12-14 ENCOUNTER — Ambulatory Visit (HOSPITAL_BASED_OUTPATIENT_CLINIC_OR_DEPARTMENT_OTHER): Payer: Medicare Other | Admitting: Oncology

## 2012-12-14 ENCOUNTER — Telehealth: Payer: Self-pay | Admitting: Oncology

## 2012-12-14 ENCOUNTER — Encounter: Payer: Self-pay | Admitting: Oncology

## 2012-12-14 VITALS — BP 116/81 | HR 71 | Temp 97.0°F | Resp 20 | Ht 65.0 in | Wt 204.6 lb

## 2012-12-14 DIAGNOSIS — C50911 Malignant neoplasm of unspecified site of right female breast: Secondary | ICD-10-CM

## 2012-12-14 DIAGNOSIS — Z170 Estrogen receptor positive status [ER+]: Secondary | ICD-10-CM

## 2012-12-14 DIAGNOSIS — M948X9 Other specified disorders of cartilage, unspecified sites: Secondary | ICD-10-CM

## 2012-12-14 DIAGNOSIS — C50919 Malignant neoplasm of unspecified site of unspecified female breast: Secondary | ICD-10-CM

## 2012-12-14 DIAGNOSIS — C50319 Malignant neoplasm of lower-inner quadrant of unspecified female breast: Secondary | ICD-10-CM

## 2012-12-14 NOTE — Progress Notes (Signed)
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 The tumor was ER 55%/PR 0%/ KI 67 was 35%; and HER2/neu positive.   PAST THERAPY:  status post lumpectomy, axillary lymph node dissection in August 2008 She was enrolled in the [facility] trial  initiating chemotherapy in the form of Taxotere and carboplatin and Avastin with Herceptin on 06/2007. She completed chemotherapy on 09/17/2007. She also received radiation therapy from 10/29/2007 through 12/14/2007. She continued Herceptin and Avastin and was last treated on 05/26/2008. She began Arimidex on 12/20/2007.  CURRENT THERAPY: Arimidex  INTERVAL HISTORY: Angela [name] 72 y.o. female returns for routine follow by herself.  She used to follow with Dr. Dalene Carrow who has left the practice.  She reports feeling well. She denies any breast lesion. She denies fever, myalgia, arthralgia, headache, focal bone pain, chest pain, shots of breath, abdominal pain, visible bleeding symptom. The rest of the 14 point system was negative.  Past Medical History  Diagnosis Date  . Arthritis   . Blindness     90%  . Glaucoma(365)   . GERD (gastroesophageal reflux disease)   . Hyperlipidemia   . Irritable bowel syndrome (IBS)   . Mitral valve prolapse   . Herpes simplex type II infection 1999  . Myasthenia gravis     eyelids weakness.   . Breast cancer 03/19/2007    right  . Choledocholithiasis with obstruction 11/16/2012    Past Surgical History  Procedure Laterality Date  . Tonsillectomy  72 YEARS OLD  . Varicose vein surgery  72S  . Eye surgery  1993  . Knee surgery  2011  . Total abdominal hysterectomy w/ bilateral salpingoophorectomy    . Breast lumpectomy  04/06/2007    right  . Axillary node dissection  05/06/2007    right  . Ercp N/A 11/16/2012    Procedure: ENDOSCOPIC RETROGRADE  CHOLANGIOPANCREATOGRAPHY (ERCP);  Surgeon: Iva Boop, MD;  Location: [facility] OR;  Service: Endoscopy;  Laterality: N/A;  . Ercp  11/16/2012  . Cholecystectomy N/A 11/18/2012    Procedure: LAPAROSCOPIC CHOLECYSTECTOMY WITH INTRAOPERATIVE CHOLANGIOGRAM;  Surgeon: Lodema Pilot, DO;  Location: [facility] OR;  Service: General;  Laterality: N/A;    Current Outpatient Prescriptions  Medication Sig Dispense Refill  . anastrozole (ARIMIDEX) 1 MG tablet Take 1 mg by mouth daily.      . Calcium Carbonate-Vitamin D (CALCIUM + D PO) Take 1 tablet by mouth 2 (two) times a week. Days of the week varies - holds for loose stools      . fish oil-omega-3 fatty acids 1000 MG capsule Take 1 capsule by mouth daily. Fish oil 1200 mg + Vit D 1000  Holds for loose stools      . HYDROcodone-acetaminophen (NORCO/VICODIN) 5-325 MG per tablet Take 2 tablets by mouth every 6 (six) hours as needed.  30 tablet  0  . LOTEMAX 0.5 % ophthalmic suspension Place 2 drops into the right eye 2 (two) times daily.       . Multiple Vitamin (MULTIVITAMIN WITH MINERALS) TABS Take 1 tablet by mouth daily.      Marland Kitchen omeprazole (PRILOSEC) 20 MG capsule Take 20 mg by mouth daily.       . timolol (TIMOPTIC) 0.5 % ophthalmic solution Place 1 drop into the right eye every evening.      . traMADol (ULTRAM) 50 MG  tablet Take 50 mg by mouth daily as needed for pain.        No current facility-administered medications for this visit.    ALLERGIES:  is allergic to pravachol.   Filed Vitals:   12/14/12 1034  BP: 116/81  Pulse: 71  Temp: 97 F (36.1 C)  Resp: 20   Wt Readings from Last 3 Encounters:  12/14/12 204 lb 9.6 oz (92.806 kg)  12/08/12 203 lb 12.8 oz (92.443 kg)  11/19/12 208 lb (94.348 kg)   ECOG Performance status: 0  PHYSICAL EXAMINATION:   General:  well-nourished woman, in no acute distress.  Eyes:  no scleral icterus.  ENT:  There were no oropharyngeal lesions.  Neck was without thyromegaly.  Lymphatics:  Negative cervical,  supraclavicular or axillary adenopathy.  Respiratory: lungs were clear bilaterally without wheezing or crackles.  Cardiovascular:  Regular rate and rhythm, S1/S2, without murmur, rub or gallop.  There was no pedal edema.  GI:  abdomen was soft, flat, nontender, nondistended, without organomegaly.  Muscoloskeletal:  no spinal tenderness of palpation of vertebral spine.  Skin exam was without echymosis, petichae.  Neuro exam was nonfocal.  Patient was able to get on and off exam table without assistance.  Gait was normal.  Patient was alert and oriented.  Attention was good.   Language was appropriate.  Mood was normal without depression.  Speech was not pressured.  Thought content was not tangential.  Bilateral breast exam was negative.       LABORATORY/RADIOLOGY DATA:  Lab Results  Component Value Date   WBC 3.4* 12/11/2012   HGB 12.9 12/11/2012   HCT 39.8 12/11/2012   PLT 199 12/11/2012   GLUCOSE 117* 12/11/2012   ALKPHOS 88 12/11/2012   ALT 37 12/11/2012   AST 26 12/11/2012   NA 142 12/11/2012   K 3.9 12/11/2012   CL 107 12/11/2012   CREATININE 0.8 12/11/2012   BUN 11.8 12/11/2012   CO2 27 12/11/2012      ASSESSMENT AND PLAN:    1.  Diagnosis:  History of breast cancer. -Treatment:  Finishing up 5 years of anastrazole (aka Arimidex) - Recommendation:   *  The standard of care option is to go off of all hormonal therapy and start observation with yearly mammogram since she has finished 5 years total of hormonal therapy.   *  Second option: Continue anastrazole since there is data supporting the use of Tamoxifen for 10 years over 5 years.  There is no direct data supporting the use of aromatase inhibitors for more than 5 years. However extrapolating the result from patients with 10 years of tamoxifen, it may be beneficial to continue aromatase inhibitor for more than 5 years.  I discussed with Mr. Badolato potential risks of prolonged aromatase inhibitors which include but not limited to  osteoporosis, slightly increased risk of cardiovascular disease compared to baseline. Mr. Speas has tolerated anastrozole very well without side effects. She would like to continue this for another 5 years.   2.  Supportive care:  Her bone density showed stable T score of -1.7. I advised her to continue with calcium vitamin D.  3.  Follow up:  Mammogram in 04/2013 and return visit in 06/2013. I explained to Ms. Schweers that I will myself leave the practice in about 3 months. I will arrange for her to be seen by a breast oncologist at the breast Center here when she returns.     The length of time of the  face-to-face encounter was 15 minutes. More than 50% of time was spent counseling and coordination of care.

## 2012-12-14 NOTE — Telephone Encounter (Signed)
gv and printed appt schedule and avs to pt...schedule pt @ [facility] for Aug 6th @ 11:00am

## 2012-12-14 NOTE — Progress Notes (Signed)
12/14/2012 1:04 PM  Patient in to clinic this morning for interim visit, between FU8 and FU9. She is scheduled for return visit today to discuss hormonal therapy options for treatment of breast cancer. Patient's care is assumed by Dr. Gaylyn Rong, following Dr. Lonell Face departure from [facility]. See office visit note by Dr. Gaylyn Rong for further details of today's visit.

## 2012-12-15 ENCOUNTER — Ambulatory Visit: Payer: Medicare Other | Admitting: Hematology and Oncology

## 2013-01-04 ENCOUNTER — Telehealth: Payer: Self-pay | Admitting: *Deleted

## 2013-01-04 NOTE — Telephone Encounter (Signed)
01/04/2013  Patient was notified by phone today regarding early closure of the BETH study, noting that no efficacy benefit was found for patient receiving bevacizumab. It was also noted that there were no safety concerns. Patient was notified that she will continue to be followed per standard of care, including transfer of care to Dr. Darnelle Catalan following Dr. Lodema Pilot departure at the end of July. Patient states that she has refilled her anastrozole prescription; she intends to continue this medication and will follow-up with Dr. Darnelle Catalan as scheduled in October. Patient is also aware that no additional research blood samples will be collected, and that any blood tests will be performed per MD discretion. Patient completed study required cardiac evaluations as of October 2013.  Patient has had no recurrence of her cancer, since her last evaluation on 12/14/2012. Thanked patient for her participation in the research study.  Cindy S. Clelia Croft BSN, RN, CCRP 01/04/2013 11:47 AM

## 2013-04-01 ENCOUNTER — Inpatient Hospital Stay (HOSPITAL_COMMUNITY)
Admission: EM | Admit: 2013-04-01 | Discharge: 2013-04-03 | DRG: 313 | Disposition: A | Discharge status: Home / Self Care | Payer: Medicare Other | Attending: Internal Medicine | Admitting: Internal Medicine

## 2013-04-01 ENCOUNTER — Emergency Department (HOSPITAL_COMMUNITY): Payer: Medicare Other

## 2013-04-01 ENCOUNTER — Observation Stay (HOSPITAL_COMMUNITY): Payer: Medicare Other

## 2013-04-01 ENCOUNTER — Encounter (HOSPITAL_COMMUNITY): Payer: Self-pay | Admitting: Emergency Medicine

## 2013-04-01 DIAGNOSIS — H543 Unqualified visual loss, both eyes: Secondary | ICD-10-CM | POA: Diagnosis present

## 2013-04-01 DIAGNOSIS — R9431 Abnormal electrocardiogram [ECG] [EKG]: Secondary | ICD-10-CM

## 2013-04-01 DIAGNOSIS — C50919 Malignant neoplasm of unspecified site of unspecified female breast: Secondary | ICD-10-CM | POA: Diagnosis present

## 2013-04-01 DIAGNOSIS — E7800 Pure hypercholesterolemia, unspecified: Secondary | ICD-10-CM

## 2013-04-01 DIAGNOSIS — H409 Unspecified glaucoma: Secondary | ICD-10-CM | POA: Diagnosis present

## 2013-04-01 DIAGNOSIS — R1011 Right upper quadrant pain: Secondary | ICD-10-CM

## 2013-04-01 DIAGNOSIS — I059 Rheumatic mitral valve disease, unspecified: Secondary | ICD-10-CM | POA: Diagnosis present

## 2013-04-01 DIAGNOSIS — G7000 Myasthenia gravis without (acute) exacerbation: Secondary | ICD-10-CM | POA: Diagnosis present

## 2013-04-01 DIAGNOSIS — H8309 Labyrinthitis, unspecified ear: Secondary | ICD-10-CM | POA: Diagnosis present

## 2013-04-01 DIAGNOSIS — E785 Hyperlipidemia, unspecified: Secondary | ICD-10-CM | POA: Diagnosis present

## 2013-04-01 DIAGNOSIS — R0789 Other chest pain: Principal | ICD-10-CM | POA: Diagnosis present

## 2013-04-01 DIAGNOSIS — C50911 Malignant neoplasm of unspecified site of right female breast: Secondary | ICD-10-CM

## 2013-04-01 DIAGNOSIS — Z79899 Other long term (current) drug therapy: Secondary | ICD-10-CM

## 2013-04-01 DIAGNOSIS — M129 Arthropathy, unspecified: Secondary | ICD-10-CM | POA: Diagnosis present

## 2013-04-01 DIAGNOSIS — Z853 Personal history of malignant neoplasm of breast: Secondary | ICD-10-CM

## 2013-04-01 DIAGNOSIS — K589 Irritable bowel syndrome without diarrhea: Secondary | ICD-10-CM | POA: Diagnosis present

## 2013-04-01 DIAGNOSIS — R42 Dizziness and giddiness: Secondary | ICD-10-CM | POA: Diagnosis present

## 2013-04-01 DIAGNOSIS — R079 Chest pain, unspecified: Secondary | ICD-10-CM

## 2013-04-01 DIAGNOSIS — N390 Urinary tract infection, site not specified: Secondary | ICD-10-CM | POA: Diagnosis present

## 2013-04-01 DIAGNOSIS — K219 Gastro-esophageal reflux disease without esophagitis: Secondary | ICD-10-CM | POA: Diagnosis present

## 2013-04-01 LAB — COMPREHENSIVE METABOLIC PANEL
ALT: 23 U/L (ref 0–35)
Albumin: 3.5 g/dL (ref 3.5–5.2)
Alkaline Phosphatase: 75 U/L (ref 39–117)
BUN: 10 mg/dL (ref 6–23)
Chloride: 105 mEq/L (ref 96–112)
Glucose, Bld: 104 mg/dL — ABNORMAL HIGH (ref 70–99)
Potassium: 3.8 mEq/L (ref 3.5–5.1)
Sodium: 142 mEq/L (ref 135–145)
Total Bilirubin: 0.3 mg/dL (ref 0.3–1.2)
Total Protein: 6.9 g/dL (ref 6.0–8.3)

## 2013-04-01 LAB — CBC WITH DIFFERENTIAL/PLATELET
Eosinophils Absolute: 0.1 10*3/uL (ref 0.0–0.7)
Hemoglobin: 14.5 g/dL (ref 12.0–15.0)
Lymphs Abs: 1.9 10*3/uL (ref 0.7–4.0)
MCH: 30.5 pg (ref 26.0–34.0)
Monocytes Relative: 7 % (ref 3–12)
Neutro Abs: 1.2 10*3/uL — ABNORMAL LOW (ref 1.7–7.7)
Neutrophils Relative %: 35 % — ABNORMAL LOW (ref 43–77)
Platelets: 215 10*3/uL (ref 150–400)
RBC: 4.75 MIL/uL (ref 3.87–5.11)
WBC: 3.4 10*3/uL — ABNORMAL LOW (ref 4.0–10.5)

## 2013-04-01 LAB — URINE MICROSCOPIC-ADD ON

## 2013-04-01 LAB — URINALYSIS, ROUTINE W REFLEX MICROSCOPIC
Bilirubin Urine: NEGATIVE
Ketones, ur: NEGATIVE mg/dL
Protein, ur: NEGATIVE mg/dL
Specific Gravity, Urine: 1.023 (ref 1.005–1.030)
Urobilinogen, UA: 0.2 mg/dL (ref 0.0–1.0)

## 2013-04-01 MED ORDER — ANASTROZOLE 1 MG PO TABS
1.0000 mg | ORAL_TABLET | Freq: Every day | ORAL | Status: DC
  Administered 2013-04-01 – 2013-04-03 (×3): 1 mg via ORAL
  Filled 2013-04-01 (×3): qty 1

## 2013-04-01 MED ORDER — ENOXAPARIN SODIUM 40 MG/0.4ML ~~LOC~~ SOLN
40.0000 mg | SUBCUTANEOUS | Status: DC
  Administered 2013-04-01 – 2013-04-02 (×2): 40 mg via SUBCUTANEOUS
  Filled 2013-04-01 (×3): qty 0.4

## 2013-04-01 MED ORDER — TIMOLOL MALEATE 0.5 % OP SOLN
1.0000 [drp] | Freq: Every evening | OPHTHALMIC | Status: DC
  Administered 2013-04-01 – 2013-04-02 (×2): 1 [drp] via OPHTHALMIC
  Filled 2013-04-01: qty 5

## 2013-04-01 MED ORDER — LOTEPREDNOL ETABONATE 0.5 % OP SUSP
2.0000 [drp] | Freq: Two times a day (BID) | OPHTHALMIC | Status: DC
  Administered 2013-04-02 – 2013-04-03 (×2): 2 [drp] via OPHTHALMIC
  Filled 2013-04-01: qty 5

## 2013-04-01 MED ORDER — SODIUM CHLORIDE 0.9 % IV BOLUS (SEPSIS)
1000.0000 mL | Freq: Once | INTRAVENOUS | Status: AC
  Administered 2013-04-01: 1000 mL via INTRAVENOUS

## 2013-04-01 MED ORDER — MECLIZINE HCL 12.5 MG PO TABS
12.5000 mg | ORAL_TABLET | Freq: Three times a day (TID) | ORAL | Status: DC | PRN
  Administered 2013-04-01: 12.5 mg via ORAL
  Filled 2013-04-01 (×2): qty 1

## 2013-04-01 MED ORDER — ONDANSETRON HCL 4 MG/2ML IJ SOLN: 4.0000 mg | Freq: Four times a day (QID) | INTRAMUSCULAR | Status: DC | PRN

## 2013-04-01 MED ORDER — GADOBENATE DIMEGLUMINE 529 MG/ML IV SOLN
20.0000 mL | Freq: Once | INTRAVENOUS | Status: AC | PRN
  Administered 2013-04-01: 20 mL via INTRAVENOUS

## 2013-04-01 MED ORDER — ACETAMINOPHEN 325 MG PO TABS: 650.0000 mg | ORAL_TABLET | ORAL | Status: DC | PRN

## 2013-04-01 MED ORDER — ASPIRIN 81 MG PO CHEW
324.0000 mg | CHEWABLE_TABLET | Freq: Once | ORAL | Status: AC
  Administered 2013-04-01: 324 mg via ORAL
  Filled 2013-04-01: qty 4

## 2013-04-01 MED ORDER — MECLIZINE HCL 25 MG PO TABS
25.0000 mg | ORAL_TABLET | Freq: Once | ORAL | Status: AC
  Administered 2013-04-01: 25 mg via ORAL
  Filled 2013-04-01: qty 1

## 2013-04-01 MED ORDER — PANTOPRAZOLE SODIUM 40 MG PO TBEC
40.0000 mg | DELAYED_RELEASE_TABLET | Freq: Every day | ORAL | Status: DC
  Administered 2013-04-01 – 2013-04-03 (×3): 40 mg via ORAL
  Filled 2013-04-01 (×3): qty 1

## 2013-04-01 MED ORDER — TRAMADOL HCL 50 MG PO TABS: 50.0000 mg | ORAL_TABLET | Freq: Four times a day (QID) | ORAL | Status: DC | PRN

## 2013-04-01 NOTE — ED Notes (Signed)
Pt c/o dizziness, chest pain, and SOB.  Pt report 2 dizzy spells, one occurred 2 weeks ago and 1 occurred last night. Pt reports chest pain x1 week.  Reports r side intermittent chest pain that is precipitated by swallowing food.  Pt alert and oriented and in no distress.  No cardiac hx or hx of strokes.  Denies mental status changes. HX of r mastectomy.

## 2013-04-01 NOTE — ED Notes (Signed)
Patient transported to MRI 

## 2013-04-01 NOTE — H&P (Signed)
[facility] History and Physical  Angela [name] ZOX:[id number] DOB: 05-11-1941 DOA: 04/01/2013  Referring physician: ED physician PCP: Ron Parker, MD   Chief Complaint: shortness of breath an dizziness   HPI:  72 yo female with breast CA (no active dz known) presents to [facility] ED with main concern of progressively worsening, intermittent substernal chest discomfort, non radiating and pressure like, 5/10 in severity when present, associated with exertional shortness of breath and dizziness, no specific alleviating factors. Pt denies similar events in the past, no known history of heart problems. 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 She denies fevers, chills, no abdominal or urinary concerns.  In ED, pt stable, electrolyte panel unremarkable, first set of POCT troponins negative, pt denies chest pain. TRH asked to admit for observation and ACS rule out.   Assessment and Plan:  Principal Problem:   Dizziness - unclear etiology at this time - no specific electrolyte abnormalities noted that could explain symptoms, MRI brain unremarkable for acute findings - will admit pt to telemetry bed for observation - check TSH, vit B12 level, place order for PT evaluation - place on Meclizine temporarily to see if it helps with symptoms Chest pain with shortness of breath - unclear etiology, low suspicion for ACS - admission to telemetry bed  - cycle CE's every 8 hours, obtain 12 lead EKG, TSH - provide supportive care with IVF, analgesia as needed, oxygen as needed - will ask for CXR  Active Problems:   Breast cancer - stable Acid reflux - will continue Protonix  Glaucoma - will continue eye drops per home regimen  Code Status: Full Family Communication: Pt at bedside Disposition Plan: PT evaluation prior to discharge, admission to telemetry bed   Review of Systems:   Constitutional: Negative for fever, chills and malaise/fatigue. Negative for diaphoresis.  HENT: Negative for hearing loss, ear pain, nosebleeds, congestion, sore throat, neck pain, tinnitus and ear discharge.   Eyes: Negative for blurred vision, double vision, photophobia, pain, discharge and redness.  Respiratory: Negative for cough, hemoptysis, sputum production, wheezing and stridor.   Cardiovascular: Negative for palpitations, orthopnea, claudication and leg swelling.  Gastrointestinal: Negative for nausea, vomiting and abdominal pain. Negative for heartburn, constipation, blood in stool and melena.  Genitourinary: Negative for dysuria, urgency, frequency, hematuria and flank pain.  Musculoskeletal: Negative for myalgias, back pain, joint pain and falls.  Skin: Negative for itching and rash.  Neurological: Negative for tingling, tremors, sensory change, speech change, focal weakness, loss of consciousness and headaches.  Endo/Heme/Allergies: Negative for environmental allergies and polydipsia. Does not bruise/bleed easily.  Psychiatric/Behavioral: Negative for suicidal ideas. The patient is not nervous/anxious.      Past Medical History  Diagnosis Date  . Arthritis   . Blindness     90%  . Glaucoma   . GERD (gastroesophageal reflux disease)   . Hyperlipidemia   . Irritable bowel syndrome (IBS)   . Mitral valve prolapse   . Herpes simplex type II infection 1999  . Myasthenia gravis     eyelids weakness.   . Breast cancer 03/19/2007    right  . Choledocholithiasis with obstruction 11/16/2012    Past Surgical History  Procedure Laterality Date  . Tonsillectomy  72 YEARS OLD  . Varicose vein surgery  72S  . Eye surgery  1993  . Knee surgery  2011  . Total abdominal hysterectomy w/ bilateral salpingoophorectomy    .  Breast lumpectomy  04/06/2007    right  . Axillary node dissection  05/06/2007    right  . Ercp N/A 11/16/2012    Procedure: ENDOSCOPIC RETROGRADE  CHOLANGIOPANCREATOGRAPHY (ERCP);  Surgeon: Iva Boop, MD;  Location: [facility] OR;  Service: Endoscopy;  Laterality: N/A;  . Ercp  11/16/2012  . Cholecystectomy N/A 11/18/2012    Procedure: LAPAROSCOPIC CHOLECYSTECTOMY WITH INTRAOPERATIVE CHOLANGIOGRAM;  Surgeon: Lodema Pilot, DO;  Location: [facility] OR;  Service: General;  Laterality: N/A;    Social History:  reports that she has never smoked. She has never used smokeless tobacco. She reports that she does not drink alcohol or use illicit drugs.  Allergies  Allergen Reactions  . Pravachol Rash    Family History  Problem Relation Age of Onset  . Cancer Mother   . Hypertension Sister     Medication Sig  anastrozole (ARIMIDEX) 1 MG tablet Take 1 mg by mouth daily.  C (CALCIUM + D PO) Take 1 tablet 2 (two) times a week.  NORCO/VICODIN 5-325 MG Take 2 tablets every 6 hours as needed.  LOTEMAX 0.5 % oph suspension Place 2 drops into the right eye 2  times daily.   omeprazole (PRILOSEC) 20 MG capsule Take 20 mg by mouth daily.   timolol 0.5 % ophthalmic solution Place 1 drop into the right eye every evening.  traMADol (ULTRAM) 50 MG tablet Take 50 mg daily as needed for pain.     Physical Exam: Filed Vitals:   04/01/13 1417 04/01/13 1705  BP: 148/83 127/87  Pulse: 66 61  Temp: 98.8 F (37.1 C)   TempSrc: Oral   Resp: 20 15  SpO2: 100% 98%    Physical Exam  Constitutional: Appears well-developed and well-nourished. No distress.  HENT: Normocephalic. External right and left ear normal. Oropharynx is clear and moist.  Eyes: Conjunctivae and EOM are normal. PERRLA, no scleral icterus.  Neck: Normal ROM. Neck supple. No JVD. No tracheal deviation. No thyromegaly.  CVS: RRR, S1/S2 +, no murmurs, no gallops, no carotid bruit.  Pulmonary: Effort and breath sounds normal, no stridor, rhonchi, wheezes, rales.  Abdominal: Soft. BS +,  no distension, tenderness, rebound or guarding.  Musculoskeletal: Normal range of motion. No edema and no  tenderness.  Lymphadenopathy: No lymphadenopathy noted, cervical, inguinal. Neuro: Alert. Normal reflexes, muscle tone coordination. No cranial nerve deficit. Skin: Skin is warm and dry. No rash noted. Not diaphoretic. No erythema. No pallor.  Psychiatric: Normal mood and affect. Behavior, judgment, thought content normal.   Labs on Admission:  Basic Metabolic Panel:  Recent Labs Lab 04/01/13 1427  NA 142  K 3.8  CL 105  CO2 28  GLUCOSE 104*  BUN 10  CREATININE 0.78  CALCIUM 9.6   Liver Function Tests:  Recent Labs Lab 04/01/13 1427  AST 20  ALT 23  ALKPHOS 75  BILITOT 0.3  PROT 6.9  ALBUMIN 3.5   CBC:  Recent Labs Lab 04/01/13 1427  WBC 3.4*  NEUTROABS 1.2*  HGB 14.5  HCT 42.3  MCV 89.1  PLT 215    Radiological Exams on Admission: Mr Laqueta Jean Wo Contrast  04/01/2013   Negative MRI of brain with contrast.  No acute abnormality.    EKG: Normal sinus rhythm, no ST/T wave changes  Debbora Presto, MD  [facility] Pager [phone]  If 7PM-7AM, please contact night-coverage [email or web address] Password [facility] 04/01/2013, 6:39 PM

## 2013-04-01 NOTE — Progress Notes (Signed)
ANTICOAGULATION CONSULT NOTE - Initial Consult  Pharmacy Consult for Lovenox Indication: VTE prophylaxis  Allergies  Allergen Reactions  . Pravachol Rash    Patient Measurements:   Heparin Dosing Weight:   Vital Signs: Temp: 98.8 F (37.1 C) (07/31 1417) Temp src: Oral (07/31 1417) BP: 127/87 mmHg (07/31 1705) Pulse Rate: 61 (07/31 1705)  Labs:  Recent Labs  04/01/13 1427  HGB 14.5  HCT 42.3  PLT 215  CREATININE 0.78    The CrCl is unknown because both a height and weight (above a minimum accepted value) are required for this calculation.   Medical History: Past Medical History  Diagnosis Date  . Arthritis   . Blindness     90%  . Glaucoma   . GERD (gastroesophageal reflux disease)   . Hyperlipidemia   . Irritable bowel syndrome (IBS)   . Mitral valve prolapse   . Herpes simplex type II infection 1999  . Myasthenia gravis     eyelids weakness.   . Breast cancer 03/19/2007    right  . Choledocholithiasis with obstruction 11/16/2012   Assessment: 31 yoF with history of breast cancer admitted for dizziness and chest pain.  Pharmacy consulted to dose lovenox for VTE prophylaxis.    Height = 165cm, Weight = 91.9 kg, BMI = 33.8 Renal function:  SCr 0.78, CrCl ~ 72 ml/min CBC:  Hgb, plts WNL  Goal of Therapy:  Monitor platelets by anticoagulation protocol: Yes   Plan:  Lovenox 40mg  SQ q 24 hours (very close to BMI > 30 dosing of 0.5mg /kg/day).    Pharmacy will sign-off, but follow peripherally.   Haynes Hoehn, PharmD 04/01/2013 7:11 PM  Pager: [phone]

## 2013-04-01 NOTE — ED Notes (Signed)
Report given. Delay getting pt to floor because admitting nurse at bedside.

## 2013-04-01 NOTE — Progress Notes (Signed)
Proxy forms given for My Chart. Briscoe Burns BSN, RN-BC Admissions RN  04/01/2013 6:55 PM

## 2013-04-01 NOTE — ED Provider Notes (Signed)
CSN: [id number]     Arrival date & time 04/01/13  1406 History     First MD Initiated Contact with Patient 04/01/13 1500     Chief Complaint  Patient presents with  . Dizziness  . Shortness of Breath  . Chest Pain   (Consider location/radiation/quality/duration/timing/severity/associated sxs/prior Treatment) HPI Comments: 72 yo female with breast CA (no active dz known), cholesterol, arthritis presents with vertigo sxs. Pt had an episode one week ago that lasted all day then resolved and then this am at 330 it recurred, constant, worse with walking and movement.  No hx of similar or stroke hx.  No head injury. Pt had a brief episode of chest pain right shoulder and upper chest that resolved in minutes.  No know heart dz.  No recent stress test, no recurrent pain.  Pt has mild sob intermittent, no current sob.  Pt has chronic blindness and walks slow at baseline, normally right pupil asymmetric from surgery.  No tinnitis.  No neck pain.  Patient is a 72 y.o. female presenting with shortness of breath and chest pain. The history is provided by the patient and the spouse.  Shortness of Breath Severity:  Moderate Associated symptoms: chest pain   Associated symptoms: no abdominal pain, no fever, no headaches, no neck pain, no rash and no vomiting   Chest Pain Associated symptoms: dizziness and shortness of breath   Associated symptoms: no abdominal pain, no back pain, no fever, no headache, no numbness, not vomiting and no weakness     Past Medical History  Diagnosis Date  . Arthritis   . Blindness     90%  . Glaucoma   . GERD (gastroesophageal reflux disease)   . Hyperlipidemia   . Irritable bowel syndrome (IBS)   . Mitral valve prolapse   . Herpes simplex type II infection 1999  . Myasthenia gravis     eyelids weakness.   . Breast cancer 03/19/2007    right  . Choledocholithiasis with obstruction 11/16/2012   Past Surgical History  Procedure Laterality Date  . Tonsillectomy   72 YEARS OLD  . Varicose vein surgery  70S  . Eye surgery  1993  . Knee surgery  2011  . Total abdominal hysterectomy w/ bilateral salpingoophorectomy    . Breast lumpectomy  04/06/2007    right  . Axillary node dissection  05/06/2007    right  . Ercp N/A 11/16/2012    Procedure: ENDOSCOPIC RETROGRADE CHOLANGIOPANCREATOGRAPHY (ERCP);  Surgeon: Iva Boop, MD;  Location: [facility] OR;  Service: Endoscopy;  Laterality: N/A;  . Ercp  11/16/2012  . Cholecystectomy N/A 11/18/2012    Procedure: LAPAROSCOPIC CHOLECYSTECTOMY WITH INTRAOPERATIVE CHOLANGIOGRAM;  Surgeon: Lodema Pilot, DO;  Location: [facility] OR;  Service: General;  Laterality: N/A;   Family History  Problem Relation Age of Onset  . Cancer Mother   . Hypertension Sister    History  Substance Use Topics  . Smoking status: Never Smoker   . Smokeless tobacco: Never Used  . Alcohol Use: No   OB History   Grav Para Term Preterm Abortions TAB SAB Ect Mult Living                 Review of Systems  Constitutional: Negative for fever and chills.  HENT: Negative for neck pain and neck stiffness.   Eyes: Positive for visual disturbance.  Respiratory: Positive for shortness of breath.   Cardiovascular: Positive for chest pain.  Gastrointestinal: Negative for vomiting and abdominal pain.  Genitourinary: Negative for dysuria and flank pain.  Musculoskeletal: Negative for back pain.  Skin: Negative for rash.  Neurological: Positive for dizziness. Negative for weakness, light-headedness, numbness and headaches.    Allergies  Pravachol  Home Medications   Current Outpatient Rx  Name  Route  Sig  Dispense  Refill  . anastrozole (ARIMIDEX) 1 MG tablet   Oral   Take 1 mg by mouth daily.         . Calcium Carbonate-Vitamin D (CALCIUM + D PO)   Oral   Take 1 tablet by mouth 2 (two) times a week. Days of the week varies - holds for loose stools         . fish oil-omega-3 fatty acids 1000 MG capsule   Oral   Take 1 capsule by mouth  daily. Fish oil 1200 mg + Vit D 1000  Holds for loose stools         . HYDROcodone-acetaminophen (NORCO/VICODIN) 5-325 MG per tablet   Oral   Take 2 tablets by mouth every 6 (six) hours as needed.   30 tablet   0   . LOTEMAX 0.5 % ophthalmic suspension   Right Eye   Place 2 drops into the right eye 2 (two) times daily.          . Multiple Vitamin (MULTIVITAMIN WITH MINERALS) TABS   Oral   Take 1 tablet by mouth daily.         Marland Kitchen omeprazole (PRILOSEC) 20 MG capsule   Oral   Take 20 mg by mouth daily.          . timolol (TIMOPTIC) 0.5 % ophthalmic solution   Right Eye   Place 1 drop into the right eye every evening.         . traMADol (ULTRAM) 50 MG tablet   Oral   Take 50 mg by mouth daily as needed for pain.           BP 148/83  Pulse 66  Temp(Src) 98.8 F (37.1 C) (Oral)  Resp 20  SpO2 100% Physical Exam  Nursing note and vitals reviewed. Constitutional: She is oriented to person, place, and time. She appears well-developed and well-nourished.  HENT:  Head: Normocephalic and atraumatic.  Eyes: Right eye exhibits no discharge. Left eye exhibits no discharge.  Right pupil surgical changes, asymetric, larger than left which is normal appearing  Neck: Normal range of motion. Neck supple. No tracheal deviation present.  Cardiovascular: Normal rate and regular rhythm.   Pulmonary/Chest: Effort normal and breath sounds normal.  Abdominal: Soft. She exhibits no distension. There is no tenderness. There is no guarding.  Musculoskeletal: She exhibits no edema.  Neurological: She is alert and oriented to person, place, and time. GCS eye subscore is 4. GCS verbal subscore is 5. GCS motor subscore is 6.  5+ strength in UE and LE with f/e at major joints. Sensation to palpation intact in UE and LE. CNs 2-12 grossly intact except right pupil. EOMFI.  PERRL.   Finger nose and coordination intact bilateral.   Visual fields difficult to assess due to previous vision  issuses Pos romberg, difficulty with gait   Skin: Skin is warm. No rash noted.  Psychiatric: She has a normal mood and affect.    ED Course   Procedures (including critical care time)  Labs Reviewed  CBC WITH DIFFERENTIAL - Abnormal; Notable for the following:    WBC 3.4 (*)    Neutrophils Relative % 35 (*)  Neutro Abs 1.2 (*)    Lymphocytes Relative 55 (*)    All other components within normal limits  COMPREHENSIVE METABOLIC PANEL - Abnormal; Notable for the following:    Glucose, Bld 104 (*)    GFR calc non Af Amer 83 (*)    All other components within normal limits  URINALYSIS, ROUTINE W REFLEX MICROSCOPIC  POCT I-STAT TROPONIN I   No results found. No diagnosis found.  MDM  Pt main concern is vertigo. With age and balance change, MRI to look for stroke.  No cp or sob today, screening labs done. ASA given. ASA given.   Date: 04/01/2013  Rate: 63  Rhythm: normal sinus rhythm  QRS Axis: left  Intervals: normal  ST/T Wave abnormalities: nonspecific T wave changes  t wave inversions anterior  Conduction Disutrbances:left anterior fascicular block  Narrative Interpretation: no olds  No cp in ed. Pt improved on recheck. MRI neg for stroke.   Mr Laqueta Jean Wo Contrast  04/01/2013   *RADIOLOGY REPORT*  Clinical Data: Dizziness and unsteady gait.  Vertigo.  Breast cancer  MRI HEAD WITHOUT AND WITH CONTRAST  Technique:  Multiplanar, multiecho pulse sequences of the brain and surrounding structures were obtained according to standard protocol without and with intravenous contrast  Contrast: 20mL MULTIHANCE GADOBENATE DIMEGLUMINE 529 MG/ML IV SOLN  Comparison: MRI 05/05/2007  Findings: Negative for acute infarct.  Negative for metastatic disease.  Cerebral white matter and brainstem are normal.  Negative for chronic ischemia.  Ventricle size is normal.  No hemorrhage or mass lesion is identified.  Normal enhancement post contrast administration.  IMPRESSION: Negative MRI of brain  with contrast.  No acute abnormality.   Original Report Authenticated By: Janeece Riggers, M.D.   Discussed close fup outpt vs observation, pt prefers observation.  Abnormal ekg, no olds.  Concern for CAD.  MRI neg for stroke.   No sxs at time of admission.   Enid Skeens, MD 04/01/13 [phone]

## 2013-04-01 NOTE — ED Notes (Signed)
Pt returned from MRI °

## 2013-04-02 DIAGNOSIS — R42 Dizziness and giddiness: Secondary | ICD-10-CM

## 2013-04-02 DIAGNOSIS — R072 Precordial pain: Secondary | ICD-10-CM

## 2013-04-02 DIAGNOSIS — C50919 Malignant neoplasm of unspecified site of unspecified female breast: Secondary | ICD-10-CM

## 2013-04-02 LAB — BASIC METABOLIC PANEL
Calcium: 9.1 mg/dL (ref 8.4–10.5)
Creatinine, Ser: 0.74 mg/dL (ref 0.50–1.10)
GFR calc Af Amer: >90 mL/min (ref >90)
GFR calc non Af Amer: 84 mL/min — ABNORMAL LOW (ref >90)
Sodium: 140 mEq/L (ref 135–145)

## 2013-04-02 LAB — CBC
MCH: 29.7 pg (ref 26.0–34.0)
MCV: 90.2 fL (ref 78.0–100.0)
Platelets: 195 10*3/uL (ref 150–400)
RDW: 13.9 % (ref 11.5–15.5)

## 2013-04-02 LAB — TSH: TSH: 0.395 u[IU]/mL (ref 0.350–4.500)

## 2013-04-02 MED ORDER — DEXTROSE 5 % IV SOLN
1.0000 g | INTRAVENOUS | Status: DC
  Administered 2013-04-02 – 2013-04-03 (×2): 1 g via INTRAVENOUS
  Filled 2013-04-02 (×2): qty 10

## 2013-04-02 NOTE — Progress Notes (Signed)
  Echocardiogram 2D Echocardiogram has been performed.  Arvil Chaco 04/02/2013, 1:54 PM

## 2013-04-02 NOTE — Progress Notes (Signed)
Chaplain responded to spiritual care consult generated by pt response to admission questions.   Pt in room with family. Introduced spiritual care as resource.  Pt expressed no needs at this time.    Belva Crome MDiv

## 2013-04-02 NOTE — Progress Notes (Signed)
[facility] PROGRESS NOTE  Torrance LUNABELLA BADGETT ZOX:[id number] DOB: Sep 18, 1940 DOA: 04/01/2013 PCP: Ron Parker, MD  Assessment/Plan: 1. Atypical R sided chest pain -resolved, around site of prior surgery -EKG with non specific changes  -check 2D ECHO  2. Dizziness with UTI -suspect labrynthitis -check Orthostatics, PT eval -MRI normal -no events on Tele  3. H/o Breast CA -s/p lumpectomy, on arimidex -Fu with Oncology  4. GERD -continue PPI  5. ? UTI -on ceftriaxone, FU cultures  Code Status: Full Family Communication:none at bedside Disposition Plan: home later today or in am   Consultants:  PT  HPI/Subjective: Feels better, atypical R sided chest pain last week and dizziness and URI symptoms for 1 week  Objective: Filed Vitals:   04/02/13 1130 04/02/13 1135 04/02/13 1145 04/02/13 1150  BP: 120/73 116/65 108/80 126/60  Pulse:      Temp:      TempSrc:      Resp:      Height:      Weight:      SpO2:        Intake/Output Summary (Last 24 hours) at 04/02/13 1328 Last data filed at 04/02/13 0900  Gross per 24 hour  Intake    770 ml  Output      0 ml  Net    770 ml   Filed Weights   04/01/13 1900  Weight: 91.9 kg (202 lb 9.6 oz)    Exam:   General:  AAOx3  Cardiovascular: S1S2/RRR  Respiratory: CTAB  Abdomen: soft, Nt, BS present  Musculoskeletal: no edema c/c   Data Reviewed: Basic Metabolic Panel:  Recent Labs Lab 04/01/13 1427 04/02/13 0650  NA 142 140  K 3.8 3.7  CL 105 105  CO2 28 25  GLUCOSE 104* 95  BUN 10 11  CREATININE 0.78 0.74  CALCIUM 9.6 9.1   Liver Function Tests:  Recent Labs Lab 04/01/13 1427  AST 20  ALT 23  ALKPHOS 75  BILITOT 0.3  PROT 6.9  ALBUMIN 3.5   No results found for this basename: LIPASE, AMYLASE,  in the last 168 hours No results found for this basename: AMMONIA,  in the last 168 hours CBC:  Recent Labs Lab 04/01/13 1427 04/02/13 0650  WBC 3.4* 3.0*  NEUTROABS 1.2*  --    HGB 14.5 13.0  HCT 42.3 39.5  MCV 89.1 90.2  PLT 215 195   Cardiac Enzymes:  Recent Labs Lab 04/01/13 2004 04/02/13 0125 04/02/13 0650  TROPONINI <0.30 <0.30 <0.30   BNP (last 3 results) No results found for this basename: PROBNP,  in the last 8760 hours CBG: No results found for this basename: GLUCAP,  in the last 168 hours  No results found for this or any previous visit (from the past 240 hour(s)).   Studies: Dg Chest 2 View  04/01/2013   *RADIOLOGY REPORT*  Clinical Data: Dizziness, shortness of breath and chest pain  CHEST - 2 VIEW  Comparison: 03/09/2008  Findings: Normal heart, mediastinal, and hilar contours.  Stable mild linear areas of scarring in the left midlung and right lung base.  Negative for airspace disease, pleural effusion, or pneumothorax.  Right axillary surgical clips noted. Cholecystectomy clips present.  No acute osseous abnormality. Visualized bowel gas pattern is nonobstructive.  IMPRESSION: Stable chest radiograph.  No acute findings.   Original Report Authenticated By: Britta Mccreedy, M.D.   Mr Laqueta Jean Wo Contrast  04/01/2013   *RADIOLOGY REPORT*  Clinical Data: Dizziness and unsteady gait.  Vertigo.  Breast cancer  MRI HEAD WITHOUT AND WITH CONTRAST  Technique:  Multiplanar, multiecho pulse sequences of the brain and surrounding structures were obtained according to standard protocol without and with intravenous contrast  Contrast: 20mL MULTIHANCE GADOBENATE DIMEGLUMINE 529 MG/ML IV SOLN  Comparison: MRI 05/05/2007  Findings: Negative for acute infarct.  Negative for metastatic disease.  Cerebral white matter and brainstem are normal.  Negative for chronic ischemia.  Ventricle size is normal.  No hemorrhage or mass lesion is identified.  Normal enhancement post contrast administration.  IMPRESSION: Negative MRI of brain with contrast.  No acute abnormality.   Original Report Authenticated By: Janeece Riggers, M.D.    Scheduled Meds: . anastrozole  1 mg Oral  Daily  . cefTRIAXone (ROCEPHIN)  IV  1 g Intravenous Q24H  . enoxaparin (LOVENOX) injection  40 mg Subcutaneous Q24H  . loteprednol  2 drop Right Eye BID  . pantoprazole  40 mg Oral Daily  . timolol  1 drop Right Eye QPM   Continuous Infusions:   Principal Problem:   Dizziness Active Problems:   Breast cancer    Time spent:30min    [facility]  [facility] Pager [phone]. If 7PM-7AM, please contact night-coverage at [email or web address], password [facility] 04/02/2013, 1:28 PM  LOS: 1 day

## 2013-04-02 NOTE — Progress Notes (Signed)
EKG performed and shown to [facility] [employer] due to EKG machine would not download and transfer into computer.  Barnett Hatter P

## 2013-04-02 NOTE — Evaluation (Signed)
Physical Therapy Evaluation Patient Details Name: Angela [name] MRN: [id number] DOB: 25-Jan-1941 Today's Date: 04/02/2013 Time: 4782-9562 PT Time Calculation (min): 18 min  PT Assessment / Plan / Recommendation History of Present Illness  72 yo female with breast CA (no active dz known) presents to [facility] ED with main concern of progressively worsening, intermittent substernal chest discomfort, non radiating and pressure like, 5/10 in severity when present, associated with exertional shortness of breath and dizziness, no specific alleviating factors. Pt denies similar events in the past, no known history of heart problems. 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 Pt c/o dizziness. Started on [employer].  Clinical Impression  Pt tolerated ambulation with no c/o dizziness. Orthostatic BP'a in Doc Flowsheets. RN aware. 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     PT Assessment  Patient needs continued PT services    Follow Up Recommendations  No PT follow up    Does the patient have the potential to tolerate intense rehabilitation      Barriers to Discharge        Equipment Recommendations  None recommended by PT    Recommendations for Other Services     Frequency Min 3X/week    Precautions / Restrictions Precautions Precautions: Fall   Pertinent Vitals/Pain See doc flowsheets  At 1130      Mobility  Bed Mobility Bed Mobility: Supine to Sit Supine to Sit: 7: Independent Transfers Transfers: Sit to Stand;Stand to Sit Sit to Stand: 5: Supervision;With armrests;From bed Stand to Sit: 5: Supervision;To chair/3-in-1;With upper extremity assist Ambulation/Gait Ambulation/Gait Assistance: 5: Supervision Ambulation Distance (Feet): 60 Feet Assistive device: Rolling walker Ambulation/Gait Assistance Details: pt  reports R knee has bothered her after a scope. Gait Pattern: Step-through pattern Gait velocity: decr.    Exercises     PT Diagnosis: Difficulty walking;Generalized weakness  PT Problem List: Decreased strength;Decreased activity tolerance;Decreased mobility;Decreased knowledge of use of DME PT Treatment Interventions: DME instruction;Gait training;Functional mobility training;Therapeutic activities;Therapeutic exercise;Patient/family education     PT Goals(Current goals can be found in the care plan section) Acute Rehab PT Goals Patient Stated Goal: i want to go home PT Goal Formulation: With patient Time For Goal Achievement: 04/16/13 Potential to Achieve Goals: Good  Visit Information  Last PT Received On: 04/02/13 Assistance Needed: +1 History of Present Illness: 72 yo female with breast CA (no active dz known) presents to [facility] ED with main concern of progressively worsening, intermittent substernal chest discomfort, non radiating and pressure like, 5/10 in severity when present, associated with exertional shortness of breath and dizziness, no specific alleviating factors. Pt denies similar events in the past, no known history of heart problems. 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 Started on [employer].       Prior Functioning  Home Living Family/patient expects to be discharged to:: Private residence Living Arrangements: Spouse/significant other Available Help at Discharge: Personal care attendant Home Access: Stairs to enter Entrance Stairs-Number of Steps: 2 Entrance Stairs-Rails: Right Home Layout: One level Home Equipment: Walker - 2 wheels Additional Comments: Spouse is WC bound with MS. Pt provides care for pt for transfers, etc. Prior Function Level of Independence: Independent Communication Communication: No difficulties    Cognition   Cognition Arousal/Alertness: Awake/alert Behavior During  Therapy: WFL for tasks assessed/performed Overall Cognitive Status: Within Functional Limits for tasks assessed    Extremity/Trunk Assessment Upper Extremity Assessment Upper Extremity Assessment: Overall WFL for tasks assessed Lower Extremity Assessment Lower Extremity Assessment: Overall WFL for tasks assessed Cervical / Trunk Assessment Cervical / Trunk Assessment: Normal   Balance Balance Balance Assessed: Yes Static Standing Balance Static Standing - Balance Support: Right upper extremity supported Static Standing - Level of Assistance: 6: Modified independent (Device/Increase time)  End of Session PT - End of Session Activity Tolerance: Patient tolerated treatment well Patient left: in chair;with call bell/phone within reach;with family/visitor present Nurse Communication: Mobility status (orthostatic BP's taken.)  GP     Rada Hay 04/02/2013, 11:57 AM  Blanchard Kelch PT [phone]

## 2013-04-03 DIAGNOSIS — H8309 Labyrinthitis, unspecified ear: Secondary | ICD-10-CM | POA: Diagnosis present

## 2013-04-03 MED ORDER — CEFUROXIME AXETIL 250 MG PO TABS: 250.0000 mg | ORAL_TABLET | Freq: Two times a day (BID) | ORAL | Status: DC

## 2013-04-03 MED ORDER — MECLIZINE HCL 12.5 MG PO TABS: 12.5000 mg | ORAL_TABLET | Freq: Three times a day (TID) | ORAL | Status: DC | PRN

## 2013-04-04 LAB — URINE CULTURE

## 2013-04-28 NOTE — Discharge Summary (Signed)
Physician Discharge Summary  Angela [name] NWG:[id number] DOB: 07-25-1941 DOA: 04/01/2013  PCP: Ron Parker, MD  Admit date: 04/01/2013 Discharge date: 04/03/2013  Time spent: 45 minutes  Recommendations for Outpatient Follow-up:  1. PCP in 1 week  Discharge Diagnoses:  Principal Problem:   Dizziness Active Problems:   Breast cancer   Vestibular labyrinthitis   Atypical Chest pain  Discharge Condition: stable  Diet recommendation: low soidum  Filed Weights   04/01/13 1900  Weight: 91.9 kg (202 lb 9.6 oz)    History of present illness:  72 yo female with breast CA (no active dz known) presents to [facility] ED with main concern of progressively worsening, intermittent substernal chest discomfort, non radiating and pressure like, 5/10 in severity when present, associated with exertional shortness of breath and dizziness, no specific alleviating factors. Pt denies similar events in the past, no known history of heart problems. 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 She denies fevers, chills, no abdominal or urinary concerns.  Hospital Course:  1. Atypical R sided chest pain -resolved, around site of prior surgery -ruled out based on cardiac enzymes x3 -EKG with non specific changes  -2D ECHO with normal EF and wall motion  2. Dizziness with UTI  -suspect labrynthitis with recent URI -Orthostatics negative, PT eval completed -MRI normal  -no events on Tele  -improved  3. H/o Breast CA  -s/p lumpectomy, on arimidex  -Fu with Oncology   4. GERD  -continue PPI     Procedures:  ECHO Study Conclusions Left ventricle: The cavity size was normal. Wall thickness was normal. Systolic function was normal. The estimated ejection fraction was in the range of 55% to 60%. Wall motion was normal; there were no regional wall motion abnormalities. Left ventricular  diastolic function parameters were normal.    Discharge Exam: Filed Vitals:   04/03/13 0607  BP: 115/73  Pulse: 61  Temp: 98.1 F (36.7 C)  Resp: 16    General: AAOx3 Cardiovascular: S1S2/RRR Respiratory: CTAB  Discharge Instructions  Discharge Orders   Future Appointments Provider Department Dept Phone   06/14/2013 10:30 AM Dava Najjar Idelle Jo [facility] MED[facility]Y [phone]   06/14/2013 11:00 AM Lowella Dell, MD [facility] MEDICAL ONCOLOGY [phone]   Future Orders Complete By Expires   Diet - low sodium heart healthy  As directed    Increase activity slowly  As directed        Medication List         anastrozole 1 MG tablet  Commonly known as:  ARIMIDEX  Take 1 mg by mouth daily.     CALCIUM + D PO  Take 1 tablet by mouth 2 (two) times a week. Days of the week varies - holds for loose stools     cefUROXime 250 MG tablet  Commonly known as:  CEFTIN  Take 1 tablet (250 mg total) by mouth 2 (two) times daily. For 3 days     fish oil-omega-3 fatty acids 1000 MG capsule  Take 1 capsule by mouth daily. Fish oil 1200 mg + Vit D 1000  Holds for loose stools     HYDROcodone-acetaminophen 5-325 MG per tablet  Commonly known as:  NORCO/VICODIN  Take 2 tablets by mouth every 6 (six) hours as needed.     LOTEMAX 0.5 % ophthalmic suspension  Generic drug:  loteprednol  Place 2 drops  into the right eye 2 (two) times daily.     meclizine 12.5 MG tablet  Commonly known as:  ANTIVERT  Take 1 tablet (12.5 mg total) by mouth 3 (three) times daily as needed for dizziness or nausea.     multivitamin with minerals Tabs tablet  Take 1 tablet by mouth daily.     omeprazole 20 MG capsule  Commonly known as:  PRILOSEC  Take 20 mg by mouth daily.     timolol 0.5 % ophthalmic solution  Commonly known as:  TIMOPTIC  Place 1 drop into the right eye every evening.     traMADol 50 MG tablet  Commonly known as:  ULTRAM  Take 50 mg  by mouth daily as needed for pain.       Allergies  Allergen Reactions  . Pravachol Rash       Follow-up Information   Follow up with Ron Parker, MD. Schedule an appointment as soon as possible for a visit in 1 week.   Specialty:  Internal Medicine   Contact information:   [address] [place] [address] [phone]        The results of significant diagnostics from this hospitalization (including imaging, microbiology, ancillary and laboratory) are listed below for reference.    Significant Diagnostic Studies: Dg Chest 2 View  04/01/2013   *RADIOLOGY REPORT*  Clinical Data: Dizziness, shortness of breath and chest pain  CHEST - 2 VIEW  Comparison: 03/09/2008  Findings: Normal heart, mediastinal, and hilar contours.  Stable mild linear areas of scarring in the left midlung and right lung base.  Negative for airspace disease, pleural effusion, or pneumothorax.  Right axillary surgical clips noted. Cholecystectomy clips present.  No acute osseous abnormality. Visualized bowel gas pattern is nonobstructive.  IMPRESSION: Stable chest radiograph.  No acute findings.   Original Report Authenticated By: Britta Mccreedy, M.D.   Mr Laqueta Jean Wo Contrast  04/01/2013   *RADIOLOGY REPORT*  Clinical Data: Dizziness and unsteady gait.  Vertigo.  Breast cancer  MRI HEAD WITHOUT AND WITH CONTRAST  Technique:  Multiplanar, multiecho pulse sequences of the brain and surrounding structures were obtained according to standard protocol without and with intravenous contrast  Contrast: 20mL MULTIHANCE GADOBENATE DIMEGLUMINE 529 MG/ML IV SOLN  Comparison: MRI 05/05/2007  Findings: Negative for acute infarct.  Negative for metastatic disease.  Cerebral white matter and brainstem are normal.  Negative for chronic ischemia.  Ventricle size is normal.  No hemorrhage or mass lesion is identified.  Normal enhancement post contrast administration.  IMPRESSION: Negative MRI of brain with  contrast.  No acute abnormality.   Original Report Authenticated By: Janeece Riggers, M.D.    Microbiology: No results found for this or any previous visit (from the past 240 hour(s)).   Labs: Basic Metabolic Panel: No results found for this basename: NA, K, CL, CO2, GLUCOSE, BUN, CREATININE, CALCIUM, MG, PHOS,  in the last 168 hours Liver Function Tests: No results found for this basename: AST, ALT, ALKPHOS, BILITOT, PROT, ALBUMIN,  in the last 168 hours No results found for this basename: LIPASE, AMYLASE,  in the last 168 hours No results found for this basename: AMMONIA,  in the last 168 hours CBC: No results found for this basename: WBC, NEUTROABS, HGB, HCT, MCV, PLT,  in the last 168 hours Cardiac Enzymes: No results found for this basename: CKTOTAL, CKMB, CKMBINDEX, TROPONINI,  in the last 168 hours BNP: BNP (last 3 results) No results found for this  basename: PROBNP,  in the last 8760 hours CBG: No results found for this basename: GLUCAP,  in the last 168 hours     Signed:  Virjean Boman  [facility] 04/28/2013, 1:51 PM

## 2013-06-14 ENCOUNTER — Ambulatory Visit (HOSPITAL_BASED_OUTPATIENT_CLINIC_OR_DEPARTMENT_OTHER): Payer: Medicare Other | Admitting: Oncology

## 2013-06-14 ENCOUNTER — Telehealth: Payer: Self-pay | Admitting: *Deleted

## 2013-06-14 ENCOUNTER — Other Ambulatory Visit (HOSPITAL_BASED_OUTPATIENT_CLINIC_OR_DEPARTMENT_OTHER): Payer: Medicare Other | Admitting: Lab

## 2013-06-14 ENCOUNTER — Other Ambulatory Visit: Payer: Self-pay | Admitting: Oncology

## 2013-06-14 ENCOUNTER — Encounter (INDEPENDENT_AMBULATORY_CARE_PROVIDER_SITE_OTHER): Payer: Self-pay

## 2013-06-14 VITALS — BP 140/95 | HR 57 | Temp 97.7°F | Resp 18 | Ht 64.0 in | Wt 210.5 lb

## 2013-06-14 DIAGNOSIS — C50319 Malignant neoplasm of lower-inner quadrant of unspecified female breast: Secondary | ICD-10-CM

## 2013-06-14 DIAGNOSIS — Z170 Estrogen receptor positive status [ER+]: Secondary | ICD-10-CM

## 2013-06-14 DIAGNOSIS — C50919 Malignant neoplasm of unspecified site of unspecified female breast: Secondary | ICD-10-CM

## 2013-06-14 DIAGNOSIS — C50911 Malignant neoplasm of unspecified site of right female breast: Secondary | ICD-10-CM

## 2013-06-14 DIAGNOSIS — C50311 Malignant neoplasm of lower-inner quadrant of right female breast: Secondary | ICD-10-CM | POA: Insufficient documentation

## 2013-06-14 DIAGNOSIS — M8580 Other specified disorders of bone density and structure, unspecified site: Secondary | ICD-10-CM

## 2013-06-14 LAB — CBC WITH DIFFERENTIAL/PLATELET
BASO%: 0.3 % (ref 0.0–2.0)
Basophils Absolute: 0 10*3/uL (ref 0.0–0.1)
EOS%: 3.4 % (ref 0.0–7.0)
Eosinophils Absolute: 0.1 10*3/uL (ref 0.0–0.5)
HCT: 40.3 % (ref 34.8–46.6)
HGB: 13.5 g/dL (ref 11.6–15.9)
LYMPH%: 55.9 % — ABNORMAL HIGH (ref 14.0–49.7)
MCH: 29.9 pg (ref 25.1–34.0)
MCHC: 33.5 g/dL (ref 31.5–36.0)
MCV: 89.4 fL (ref 79.5–101.0)
MONO#: 0.3 10*3/uL (ref 0.1–0.9)
MONO%: 8.5 % (ref 0.0–14.0)
NEUT#: 0.9 10*3/uL — ABNORMAL LOW (ref 1.5–6.5)
NEUT%: 31.9 % — ABNORMAL LOW (ref 38.4–76.8)
Platelets: 206 10*3/uL (ref 145–400)
RBC: 4.51 10*6/uL (ref 3.70–5.45)
RDW: 14.1 % (ref 11.2–14.5)
WBC: 3 10*3/uL — ABNORMAL LOW (ref 3.9–10.3)
lymph#: 1.7 10*3/uL (ref 0.9–3.3)
nRBC: 0 % (ref 0–0)

## 2013-06-14 LAB — COMPREHENSIVE METABOLIC PANEL (CC13)
AST: 20 U/L (ref 5–34)
Albumin: 3.2 g/dL — ABNORMAL LOW (ref 3.5–5.0)
Alkaline Phosphatase: 76 U/L (ref 40–150)
Anion Gap: 9 mEq/L (ref 3–11)
Potassium: 3.8 mEq/L (ref 3.5–5.1)
Sodium: 143 mEq/L (ref 136–145)
Total Bilirubin: 0.42 mg/dL (ref 0.20–1.20)
Total Protein: 6.7 g/dL (ref 6.4–8.3)

## 2013-06-14 MED ORDER — ANASTROZOLE 1 MG PO TABS: 1.0000 mg | ORAL_TABLET | Freq: Every day | ORAL | Status: DC

## 2013-06-14 NOTE — Telephone Encounter (Signed)
i will mail a letter/cal making the pt aware of her appt for her bone density 04/14/14@10 :15am...td

## 2013-06-14 NOTE — Telephone Encounter (Signed)
appts made and printed. Pt is aware that i will call her with appt for a bone density appt for 04/2014. td

## 2013-06-14 NOTE — Progress Notes (Signed)
ID: Angela [name] OB: [birth year]  MR#: [id number]  JXB#:[id number]  PCP: Ron Parker, MD GYN:   SUCicero Duck OTHER MD: Margaretmary Dys  CHIEF COMPLAINT:  HISTORY OF PRESENT ILLNESS: From doctor Lauretta Odogwu's intake note 04/23/2007:  "The patient was recently performing a breast exam where she noted a nodule in the right lower quadrant of the right breast.  Dr. Della Goo referred her for mammogram and ultrasound.  The mammogram and ultrasound performed on 03/15/2007 demonstrated a subcentimeter density in the lower inner quadrant of the right breast.  The density was a subcutaneous hyperechoic mass with irregular borders measuring 1 x 0.6 x 0.7 cm.  An ultrasound guided fine needle aspiration performed was consistent with an adenocarcinoma.  On 03/31/2007 the patient was referred for bilateral MRI of breasts.  The findings demonstrated a 1.3 x 1.2 x 1 cm with irregular margins in the lower inner aspect of the right breast corresponding to the subcutaneous mass in the 5 o'clock position that was previously biopsied.  There were no other masses seen and no abnormal appearing lymph nodes demonstrated.  The visualized chest wall was unremarkable.  On April 05, 2004 Dr. Cyndia Bent performed a lumpectomy with sentinel node dissection.  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  The tumor was 1 cm from the resection margin.  One of two sentinel lymph node samples was positive for metastatic adenocarcinoma.  There was evidence of lymphovascular invasion.  The tumor was positive for estrogen receptor expression and negative for progesterone receptor expression.  The invasive tumor demonstrated an increased proliferation rate.  The tumor was positive for HER2/neu overexpression as mentioned by the HercepTest method. "  The patient's subsequent history is as detailed below   INTERVAL HISTORY: Angela [name] returns today for followup of her breast cancer. She  is establishing herself in my service today.  REVIEW OF SYSTEMS: She is tolerating the anastrozole with no hot flashes or vaginal dryness issues to report. Sometimes she gets leg cramps at night. This is not persistent or intense. She tells me she is legally blind. She walks with a cane. There have been no recent falls. The only problem she has is that her right ankle is hurting. It seems to her that it is swollen. This started about a week ago. She is not aware of any trauma or twisting. Otherwise a detailed review of systems today was noncontributory  PAST MEDICAL HISTORY: Past Medical History  Diagnosis Date  . Arthritis   . Blindness     90%  . Glaucoma   . GERD (gastroesophageal reflux disease)   . Hyperlipidemia   . Irritable bowel syndrome (IBS)   . Mitral valve prolapse   . Herpes simplex type II infection 1999  . Myasthenia gravis     eyelids weakness.   . Breast cancer 03/19/2007    right  . Choledocholithiasis with obstruction 11/16/2012    PAST SURGICAL HISTORY: Past Surgical History  Procedure Laterality Date  . Tonsillectomy  72 YEARS OLD  . Varicose vein surgery  72S  . Eye surgery  1993  . Knee surgery  2011  . Total abdominal hysterectomy w/ bilateral salpingoophorectomy    . Breast lumpectomy  04/06/2007    right  . Axillary node dissection  05/06/2007    right  . Ercp N/A 11/16/2012    Procedure: ENDOSCOPIC RETROGRADE CHOLANGIOPANCREATOGRAPHY (ERCP);  Surgeon: Iva Boop, MD;  Location: [facility] OR;  Service: Endoscopy;  Laterality: N/A;  .  Ercp  11/16/2012  . Cholecystectomy N/A 11/18/2012    Procedure: LAPAROSCOPIC CHOLECYSTECTOMY WITH INTRAOPERATIVE CHOLANGIOGRAM;  Surgeon: Lodema Pilot, DO;  Location: [facility] OR;  Service: General;  Laterality: N/A;    FAMILY HISTORY Family History  Problem Relation Age of Onset  . Cancer Mother   . Hypertension Sister    no family history of breast or ovarian cancer. There is a history of prostate cancel in second-degree  relatives  GYNECOLOGIC HISTORY:  Menarche age 47, first child age 34, she is GX P2. She underwent hysterectomy with bilateral salpingo-oophorectomy in her 67s. She did not take hormone replacement  SOCIAL HISTORY:  The patient used to work in [employer], but is now retired. Her husband Jerilynn Som, used to work for [employer]. He has MS. The patient has 2 sons.    ADVANCED DIRECTIVES:    HEALTH MAINTENANCE: History  Substance Use Topics  . Smoking status: Never Smoker   . Smokeless tobacco: Never Used  . Alcohol Use: No     Colonoscopy:  PAP:  Bone density:  Lipid panel:  Allergies  Allergen Reactions  . Pravachol Rash    Current Outpatient Prescriptions  Medication Sig Dispense Refill  . anastrozole (ARIMIDEX) 1 MG tablet Take 1 mg by mouth daily.      . Calcium Carbonate-Vitamin D (CALCIUM + D PO) Take 1 tablet by mouth 2 (two) times a week. Days of the week varies - holds for loose stools      . cefUROXime (CEFTIN) 250 MG tablet Take 1 tablet (250 mg total) by mouth 2 (two) times daily. For 3 days  6 tablet  0  . fish oil-omega-3 fatty acids 1000 MG capsule Take 1 capsule by mouth daily. Fish oil 1200 mg + Vit D 1000  Holds for loose stools      . HYDROcodone-acetaminophen (NORCO/VICODIN) 5-325 MG per tablet Take 2 tablets by mouth every 6 (six) hours as needed.  30 tablet  0  . LOTEMAX 0.5 % ophthalmic suspension Place 2 drops into the right eye 2 (two) times daily.       . meclizine (ANTIVERT) 12.5 MG tablet Take 1 tablet (12.5 mg total) by mouth 3 (three) times daily as needed for dizziness or nausea.  15 tablet  0  . Multiple Vitamin (MULTIVITAMIN WITH MINERALS) TABS Take 1 tablet by mouth daily.      Marland Kitchen omeprazole (PRILOSEC) 20 MG capsule Take 20 mg by mouth daily.       . timolol (TIMOPTIC) 0.5 % ophthalmic solution Place 1 drop into the right eye every evening.      . traMADol (ULTRAM) 50 MG tablet Take 50 mg by mouth daily as needed for pain.        No current  facility-administered medications for this visit.    OBJECTIVE: Older African American woman who appears stated age  72 Vitals:   06/14/13 1012  BP: 140/95  Pulse: 57  Temp: 97.7 F (36.5 C)  Resp: 18     Body mass index is 36.11 kg/(m^2).    ECOG FS:1 - Symptomatic but completely ambulatory  Sclerae unicteric Oropharynx no thrush or other lesions No cervical or supraclavicular adenopathy Lungs no rales or rhonchi Heart regular rate and rhythm Abd soft, obese, nontender, positive bowel sounds  MSK no focal spinal tenderness, no upper extremity lymphedema Neuro: nonfocal, well oriented, pleasant affect Breasts: The right breast is status post lumpectomy    LAB RESULTS:  CMP     Component  Value Date/Time   NA 143 06/14/2013 0958   NA 140 04/02/2013 0650   K 3.8 06/14/2013 0958   K 3.7 04/02/2013 0650   CL 105 04/02/2013 0650   CL 107 12/11/2012 1104   CO2 27 06/14/2013 0958   CO2 25 04/02/2013 0650   GLUCOSE 103 06/14/2013 0958   GLUCOSE 95 04/02/2013 0650   GLUCOSE 117* 12/11/2012 1104   BUN 8.5 06/14/2013 0958   BUN 11 04/02/2013 0650   CREATININE 0.8 06/14/2013 0958   CREATININE 0.74 04/02/2013 0650   CALCIUM 9.1 06/14/2013 0958   CALCIUM 9.1 04/02/2013 0650   PROT 6.7 06/14/2013 0958   PROT 6.9 04/01/2013 1427   ALBUMIN 3.2* 06/14/2013 0958   ALBUMIN 3.5 04/01/2013 1427   AST 20 06/14/2013 0958   AST 20 04/01/2013 1427   ALT 22 06/14/2013 0958   ALT 23 04/01/2013 1427   ALKPHOS 76 06/14/2013 0958   ALKPHOS 75 04/01/2013 1427   BILITOT 0.42 06/14/2013 0958   BILITOT 0.3 04/01/2013 1427   GFRNONAA 84* 04/02/2013 0650   GFRAA >90 04/02/2013 0650    I No results found for this basename: SPEP, UPEP,  kappa and lambda light chains    Lab Results  Component Value Date   WBC 3.0* 06/14/2013   NEUTROABS 0.9* 06/14/2013   HGB 13.5 06/14/2013   HCT 40.3 06/14/2013   MCV 89.4 06/14/2013   PLT 206 06/14/2013      Chemistry      Component Value Date/Time   NA 143 06/14/2013  0958   NA 140 04/02/2013 0650   K 3.8 06/14/2013 0958   K 3.7 04/02/2013 0650   CL 105 04/02/2013 0650   CL 107 12/11/2012 1104   CO2 27 06/14/2013 0958   CO2 25 04/02/2013 0650   BUN 8.5 06/14/2013 0958   BUN 11 04/02/2013 0650   CREATININE 0.8 06/14/2013 0958   CREATININE 0.74 04/02/2013 0650      Component Value Date/Time   CALCIUM 9.1 06/14/2013 0958   CALCIUM 9.1 04/02/2013 0650   ALKPHOS 76 06/14/2013 0958   ALKPHOS 75 04/01/2013 1427   AST 20 06/14/2013 0958   AST 20 04/01/2013 1427   ALT 22 06/14/2013 0958   ALT 23 04/01/2013 1427   BILITOT 0.42 06/14/2013 0958   BILITOT 0.3 04/01/2013 1427       Lab Results  Component Value Date   LABCA2 23 12/11/2012    No components found with this basename: LABCA125    No results found for this basename: INR,  in the last 168 hours  Urinalysis    Component Value Date/Time   COLORURINE YELLOW 04/01/2013 1812   APPEARANCEUR TURBID* 04/01/2013 1812   LABSPEC 1.023 04/01/2013 1812   PHURINE 6.5 04/01/2013 1812   GLUCOSEU NEGATIVE 04/01/2013 1812   HGBUR SMALL* 04/01/2013 1812   BILIRUBINUR NEGATIVE 04/01/2013 1812   KETONESUR NEGATIVE 04/01/2013 1812   PROTEINUR NEGATIVE 04/01/2013 1812   UROBILINOGEN 0.2 04/01/2013 1812   NITRITE NEGATIVE 04/01/2013 1812   LEUKOCYTESUR LARGE* 04/01/2013 1812    STUDIES: No results found.  ASSESSMENT: 72 y.o. [place] woman status post right lumpectomy and sentinel lymph node sampling 04/06/2007 for a pT1c, pN1, stage IIA invasive ductal carcinoma, grade 2, estrogen receptor 55% positive, progesterone receptor negative, with an MIB-1 of 35%, and HercepTest amplified at 3+.  1. On 05/06/2007 she underwent right axillary lymph node dissection with an additional 14 lymph nodes benign, so the patient had a total of 2/16  lymph nodes positive.  2. She enrolled in the [facility] trial and received docetaxel, carboplatin, bevacizumab and trastuzumab through January of 2009. [The trastuzumab and bevacizumab were continued  through September of 2009].  3 completed radiation to the right breast April 2009  3. She started anastrozole April of 2009, and has opted to continue to a total of 10 years  4. patient is legally blind  PLAN: Ms. Ceja is doing well from the point of view of breast cancer. There is no evidence of disease recurrence now 6 years out from her definitive surgery. She is tolerating the anastrozole well. The biggest problem she had today was pain in the right ankle, which she may have twisted or otherwise traumatized in a minor way. We are setting her up for plain films of this today.  Her films have been obtained to [facility] and are not available today for review, but they are being requested. She will need a bone density before her next visit here.  The overall plan is to continue anastrozole on until April of 2019. The patient is aware that we do not have data for continuing this drug beyond 5 years, just as we do not have data that come tells [place] to stop it at any particular point.  She will see me again in one year. She knows to call for any problems that may develop before her next visit here.  Lowella Dell, MD   06/14/2013 10:41 AM

## 2013-06-14 NOTE — Progress Notes (Signed)
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 The tumor was ER 55%/PR 0%/ KI 67 was 35%; and HER2/neu positive.   PAST THERAPY:  status post lumpectomy, axillary lymph node dissection in August 2008 She was enrolled in the [facility] trial  initiating chemotherapy in the form of Taxotere and carboplatin and Avastin with Herceptin on 06/2007. She completed chemotherapy on 09/17/2007. She also received radiation therapy from 10/29/2007 through 12/14/2007. She continued Herceptin and Avastin and was last treated on 05/26/2008. She began Arimidex on 12/20/2007.  CURRENT THERAPY: Arimidex  INTERVAL HISTORY: Angela [name] 72 y.o. female returns for routine follow by herself.  She used to follow with Dr. Dalene Carrow who has left the practice.  She reports feeling well. She denies any breast lesion. She denies fever, myalgia, arthralgia, headache, focal bone pain, chest pain, shots of breath, abdominal pain, visible bleeding symptom. The rest of the 14 point system was negative.  Past Medical History  Diagnosis Date  . Arthritis   . Blindness     90%  . Glaucoma   . GERD (gastroesophageal reflux disease)   . Hyperlipidemia   . Irritable bowel syndrome (IBS)   . Mitral valve prolapse   . Herpes simplex type II infection 1999  . Myasthenia gravis     eyelids weakness.   . Breast cancer 03/19/2007    right  . Choledocholithiasis with obstruction 11/16/2012    Past Surgical History  Procedure Laterality Date  . Tonsillectomy  72 YEARS OLD  . Varicose vein surgery  72S  . Eye surgery  1993  . Knee surgery  2011  . Total abdominal hysterectomy w/ bilateral salpingoophorectomy    . Breast lumpectomy  04/06/2007    right  . Axillary node dissection  05/06/2007    right  . Ercp N/A 11/16/2012    Procedure: ENDOSCOPIC RETROGRADE  CHOLANGIOPANCREATOGRAPHY (ERCP);  Surgeon: Iva Boop, MD;  Location: [facility] OR;  Service: Endoscopy;  Laterality: N/A;  . Ercp  11/16/2012  . Cholecystectomy N/A 11/18/2012    Procedure: LAPAROSCOPIC CHOLECYSTECTOMY WITH INTRAOPERATIVE CHOLANGIOGRAM;  Surgeon: Lodema Pilot, DO;  Location: [facility] OR;  Service: General;  Laterality: N/A;    Current Outpatient Prescriptions  Medication Sig Dispense Refill  . anastrozole (ARIMIDEX) 1 MG tablet Take 1 mg by mouth daily.      . Calcium Carbonate-Vitamin D (CALCIUM + D PO) Take 1 tablet by mouth 2 (two) times a week. Days of the week varies - holds for loose stools      . cefUROXime (CEFTIN) 250 MG tablet Take 1 tablet (250 mg total) by mouth 2 (two) times daily. For 3 days  6 tablet  0  . fish oil-omega-3 fatty acids 1000 MG capsule Take 1 capsule by mouth daily. Fish oil 1200 mg + Vit D 1000  Holds for loose stools      . HYDROcodone-acetaminophen (NORCO/VICODIN) 5-325 MG per tablet Take 2 tablets by mouth every 6 (six) hours as needed.  30 tablet  0  . LOTEMAX 0.5 % ophthalmic suspension Place 2 drops into the right eye 2 (two) times daily.       . meclizine (ANTIVERT) 12.5 MG tablet Take 1 tablet (12.5 mg total) by mouth 3 (three) times daily as needed for dizziness or nausea.  15 tablet  0  . Multiple Vitamin (MULTIVITAMIN WITH  MINERALS) TABS Take 1 tablet by mouth daily.      Marland Kitchen omeprazole (PRILOSEC) 20 MG capsule Take 20 mg by mouth daily.       . timolol (TIMOPTIC) 0.5 % ophthalmic solution Place 1 drop into the right eye every evening.      . traMADol (ULTRAM) 50 MG tablet Take 50 mg by mouth daily as needed for pain.        No current facility-administered medications for this visit.    ALLERGIES:  is allergic to pravachol.   Filed Vitals:   06/14/13 1012  BP: 140/95  Pulse: 57  Temp: 97.7 F (36.5 C)  Resp: 18   Wt Readings from Last 3 Encounters:  06/14/13 210 lb 8 oz (95.482 kg)  04/01/13 202 lb 9.6 oz (91.9 kg)  12/14/12 204 lb  9.6 oz (92.806 kg)   ECOG Performance status: 0  PHYSICAL EXAMINATION:   General:  well-nourished woman, in no acute distress.  Eyes:  no scleral icterus.  ENT:  There were no oropharyngeal lesions.  Neck was without thyromegaly.  Lymphatics:  Negative cervical, supraclavicular or axillary adenopathy.  Respiratory: lungs were clear bilaterally without wheezing or crackles.  Cardiovascular:  Regular rate and rhythm, S1/S2, without murmur, rub or gallop.  There was no pedal edema.  GI:  abdomen was soft, flat, nontender, nondistended, without organomegaly.  Muscoloskeletal:  no spinal tenderness of palpation of vertebral spine.  Skin exam was without echymosis, petichae.  Neuro exam was nonfocal.  Patient was able to get on and off exam table without assistance.  Gait was normal.  Patient was alert and oriented.  Attention was good.   Language was appropriate.  Mood was normal without depression.  Speech was not pressured.  Thought content was not tangential.  Bilateral breast exam was negative.       LABORATORY/RADIOLOGY DATA:  Lab Results  Component Value Date   WBC 3.0* 06/14/2013   HGB 13.5 06/14/2013   HCT 40.3 06/14/2013   PLT 206 06/14/2013   GLUCOSE 95 04/02/2013   ALKPHOS 75 04/01/2013   ALT 23 04/01/2013   AST 20 04/01/2013   NA 140 04/02/2013   K 3.7 04/02/2013   CL 105 04/02/2013   CREATININE 0.74 04/02/2013   BUN 11 04/02/2013   CO2 25 04/02/2013      ASSESSMENT AND PLAN:    1.  Diagnosis:  History of breast cancer. -Treatment:  Finishing up 5 years of anastrazole (aka Arimidex) - Recommendation:   *  The standard of care option is to go off of all hormonal therapy and start observation with yearly mammogram since she has finished 5 years total of hormonal therapy.   *  Second option: Continue anastrazole since there is data supporting the use of Tamoxifen for 10 years over 5 years.  There is no direct data supporting the use of aromatase inhibitors for more than 5 years. However  extrapolating the result from patients with 10 years of tamoxifen, it may be beneficial to continue aromatase inhibitor for more than 5 years.  I discussed with Mr. Fournier potential risks of prolonged aromatase inhibitors which include but not limited to osteoporosis, slightly increased risk of cardiovascular disease compared to baseline. Mr. Huettner has tolerated anastrozole very well without side effects. She would like to continue this for another 5 years.   2.  Supportive care:  Her bone density showed stable T score of -1.7. I advised her to continue with calcium vitamin D.  3.  Follow up:  Mammogram in 04/2013 and return visit in 06/2013. I explained to Ms. Mcclarty that I will myself leave the practice in about 3 months. I will arrange for her to be seen by a breast oncologist at the breast Center here when she returns.     The length of time of the face-to-face encounter was 15 minutes. More than 50% of time was spent counseling and coordination of care.

## 2013-06-17 ENCOUNTER — Telehealth: Payer: Self-pay | Admitting: *Deleted

## 2013-06-17 NOTE — Telephone Encounter (Signed)
VM left by pt states she needs to schedule a bone density test at [facility] hospital.  Message left for Dr. Darrall Dears nurse.

## 2013-06-18 ENCOUNTER — Other Ambulatory Visit: Payer: Self-pay | Admitting: *Deleted

## 2013-06-18 NOTE — Progress Notes (Signed)
This RN reviewed chart per MD request for most recent mammogram and bone density.  Noted per Dr Gaylyn Rong noted from April 2014 pt was to have mammo in August of 2014. No record in EPIC per mammogram.  Per Mosiaq noted last mammo entered was from August 2012 and last bone density was August of 2011.  This Rn called to [facility] and left message for most recent mammogram as well as bone density if repeated. This RN left direct return call number for communication if pt has not had scans done at their facility.

## 2014-01-25 IMAGING — RF DG CHOLANGIOGRAM OPERATIVE
1 series · 4 of 4 positions shown · non-contrast
Comparison: Ultrasound 11/13/2012.  ERCP 11/16/2012.

CLINICAL DATA: Gallstones.

INTRAOPERATIVE CHOLANGIOGRAM
TECHNIQUE: Cholangiographic images from the C-arm fluoroscopic
device were submitted for interpretation post-operatively.  Please
see the procedural report for the amount of contrast and the
fluoroscopy time utilized.

[Series 1: run · 4 of 224 frames shown]
[frame 7/224]
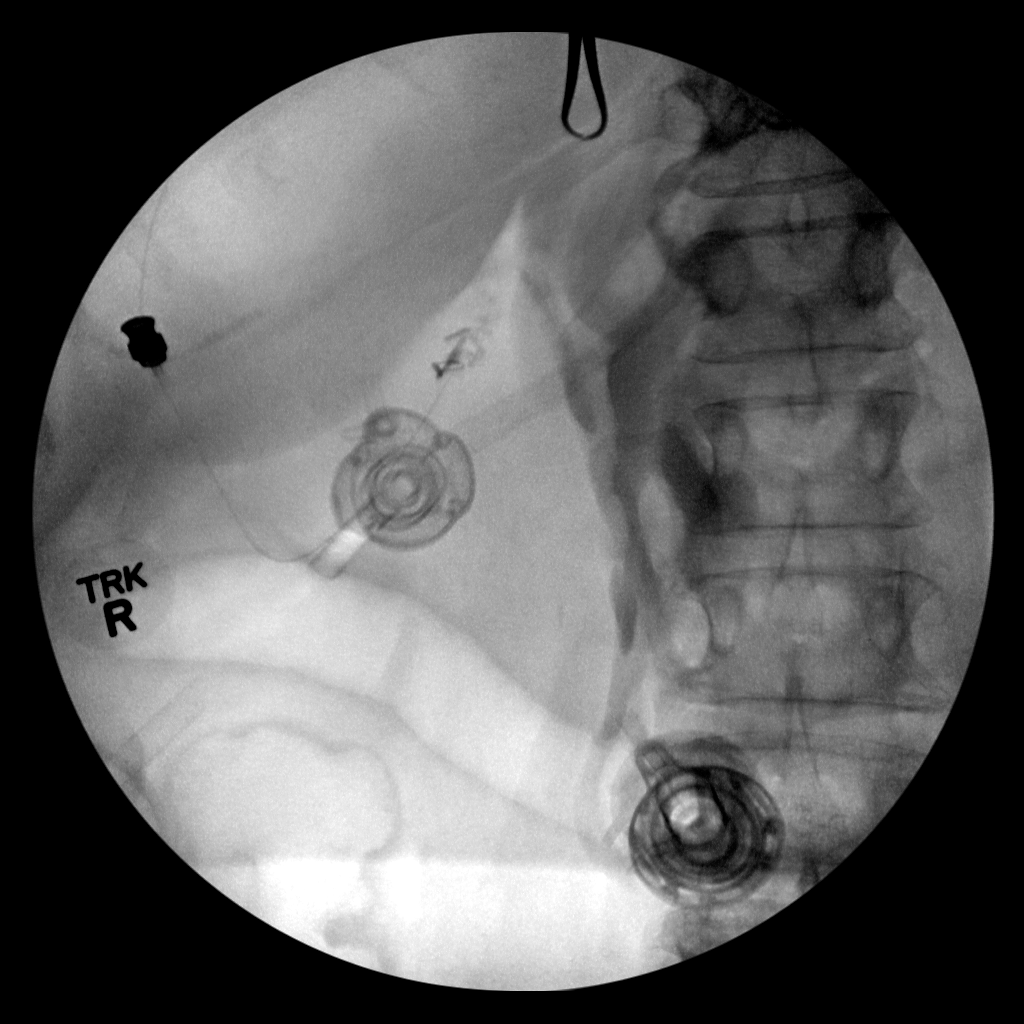
[frame 34/224]
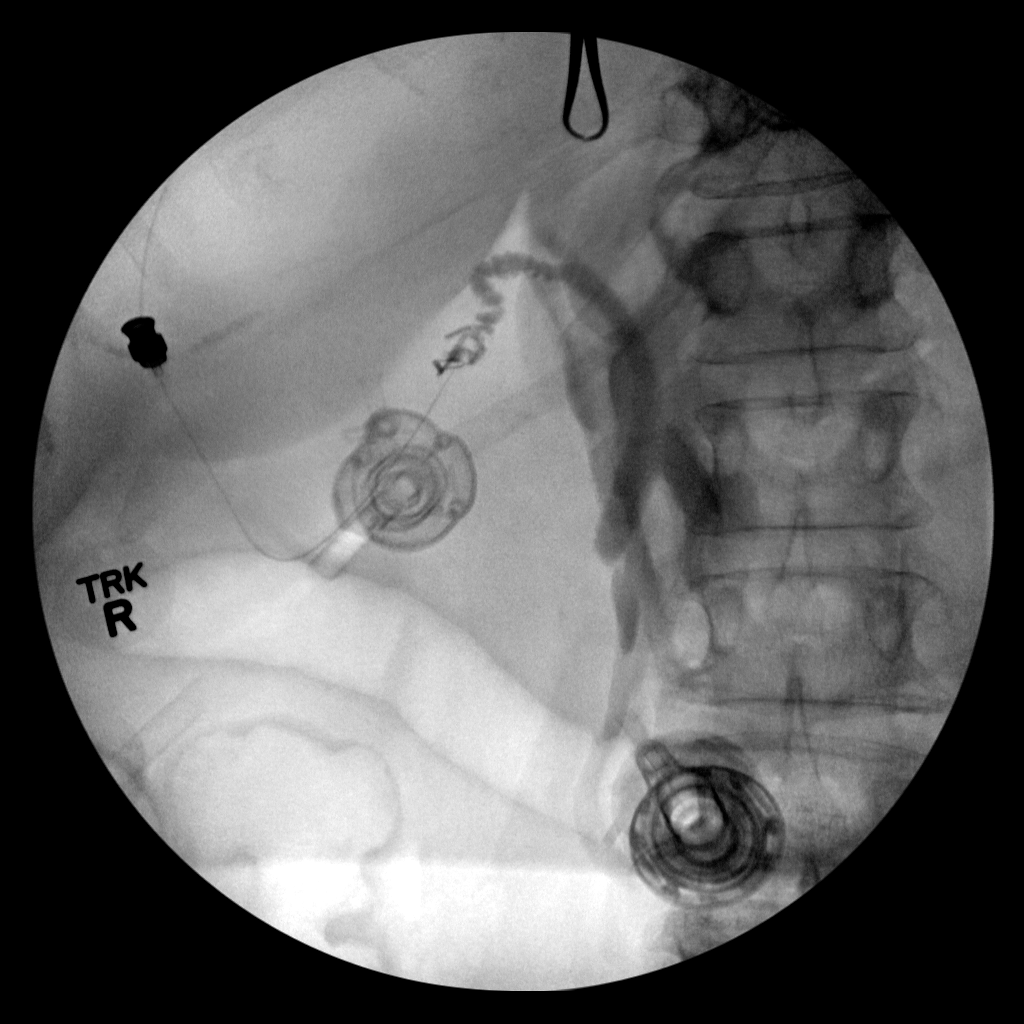
[frame 113/224]
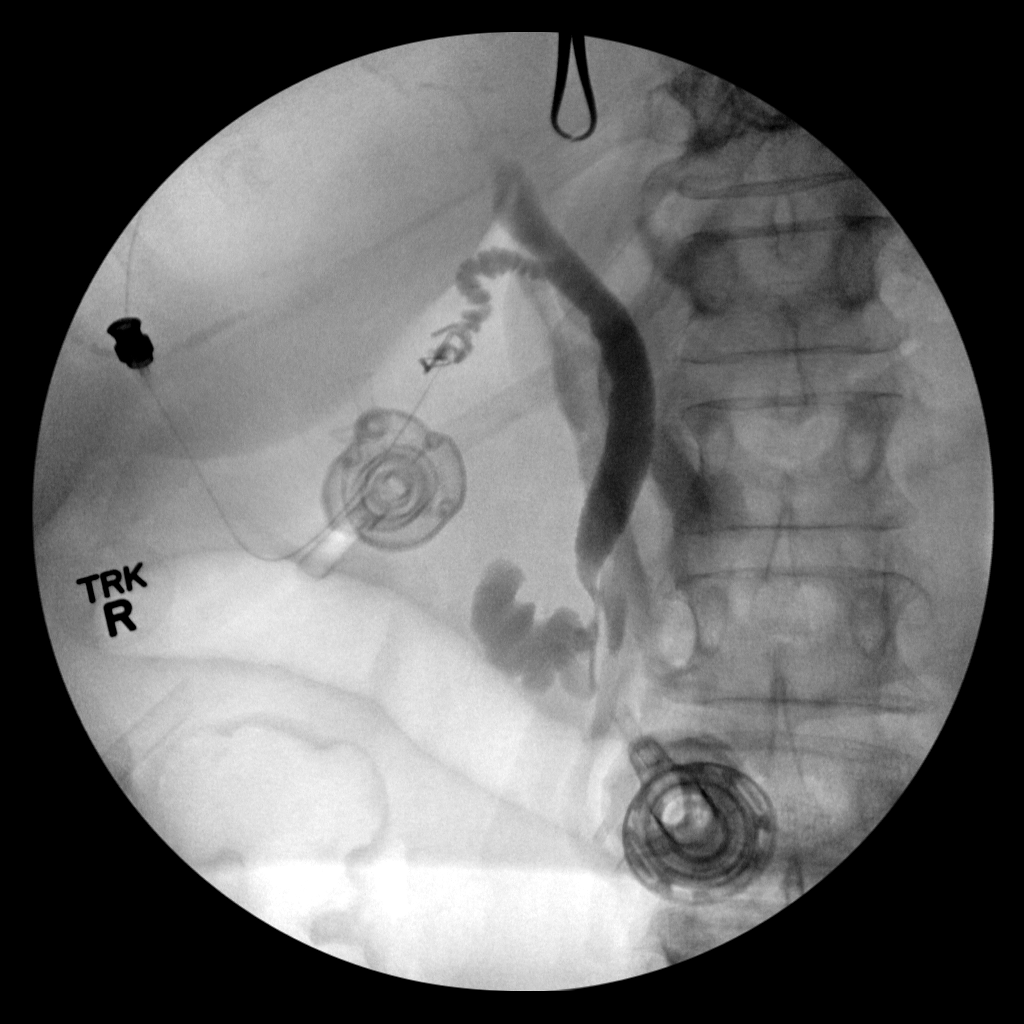
[frame 191/224]
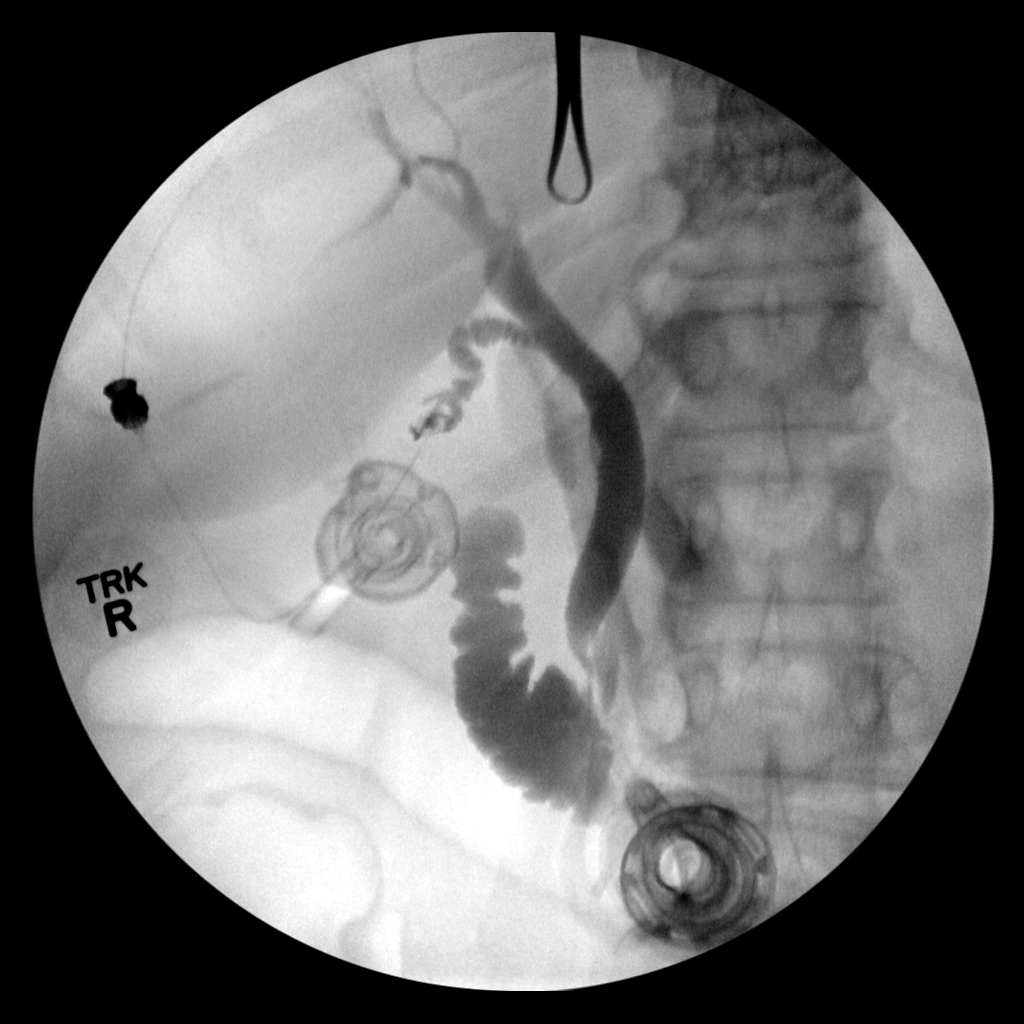

[4 of 4 positions shown; findings below may reference images not displayed]

FINDINGS: The gallbladder has been removed and the cystic duct
cannulated.  Contrast injection demonstrates no filling defects
within the common hepatic or common bile duct.  Common bile duct
does appear mildly increased in size consistent with prior imaging
studies.  There is patency of the ampulla with passage of contrast
into the duodenum, although mild stricture or spasm is not
excluded..
IMPRESSION: No retained common duct stones are seen.  Dilated common bile duct
without frank obstruction.

## 2014-03-31 ENCOUNTER — Encounter: Payer: Self-pay | Admitting: Gastroenterology

## 2014-04-14 ENCOUNTER — Ambulatory Visit (HOSPITAL_COMMUNITY): Payer: Medicare Other | Attending: Oncology

## 2014-06-14 ENCOUNTER — Other Ambulatory Visit (HOSPITAL_BASED_OUTPATIENT_CLINIC_OR_DEPARTMENT_OTHER): Payer: Medicare Other

## 2014-06-14 ENCOUNTER — Telehealth: Payer: Self-pay | Admitting: Oncology

## 2014-06-14 ENCOUNTER — Encounter: Payer: Self-pay | Admitting: Nurse Practitioner

## 2014-06-14 ENCOUNTER — Ambulatory Visit (HOSPITAL_BASED_OUTPATIENT_CLINIC_OR_DEPARTMENT_OTHER): Payer: Medicare Other | Admitting: Nurse Practitioner

## 2014-06-14 VITALS — BP 124/77 | HR 69 | Temp 97.5°F | Resp 19 | Ht 64.0 in | Wt 201.6 lb

## 2014-06-14 DIAGNOSIS — Z170 Estrogen receptor positive status [ER+]: Secondary | ICD-10-CM

## 2014-06-14 DIAGNOSIS — C50311 Malignant neoplasm of lower-inner quadrant of right female breast: Secondary | ICD-10-CM

## 2014-06-14 DIAGNOSIS — C50911 Malignant neoplasm of unspecified site of right female breast: Secondary | ICD-10-CM

## 2014-06-14 LAB — CBC WITH DIFFERENTIAL/PLATELET
BASO%: 0.4 % (ref 0.0–2.0)
Basophils Absolute: 0 10*3/uL (ref 0.0–0.1)
EOS ABS: 0.1 10*3/uL (ref 0.0–0.5)
EOS%: 3.9 % (ref 0.0–7.0)
HEMATOCRIT: 40.9 % (ref 34.8–46.6)
HGB: 13.2 g/dL (ref 11.6–15.9)
LYMPH%: 43.1 % (ref 14.0–49.7)
MCH: 29.7 pg (ref 25.1–34.0)
MCHC: 32.3 g/dL (ref 31.5–36.0)
MCV: 91.9 fL (ref 79.5–101.0)
MONO#: 0.4 10*3/uL (ref 0.1–0.9)
MONO%: 11.2 % (ref 0.0–14.0)
NEUT#: 1.6 10*3/uL (ref 1.5–6.5)
NEUT%: 41.4 % (ref 38.4–76.8)
Platelets: 210 10*3/uL (ref 145–400)
RBC: 4.45 10*6/uL (ref 3.70–5.45)
RDW: 14 % (ref 11.2–14.5)
WBC: 3.8 10*3/uL — AB (ref 3.9–10.3)
lymph#: 1.6 10*3/uL (ref 0.9–3.3)

## 2014-06-14 LAB — COMPREHENSIVE METABOLIC PANEL (CC13)
ALT: 21 U/L (ref 0–55)
ANION GAP: 9 meq/L (ref 3–11)
AST: 18 U/L (ref 5–34)
Albumin: 3.2 g/dL — ABNORMAL LOW (ref 3.5–5.0)
Alkaline Phosphatase: 78 U/L (ref 40–150)
BILIRUBIN TOTAL: 0.32 mg/dL (ref 0.20–1.20)
BUN: 8.8 mg/dL (ref 7.0–26.0)
CO2: 27 meq/L (ref 22–29)
CREATININE: 0.8 mg/dL (ref 0.6–1.1)
Calcium: 9.5 mg/dL (ref 8.4–10.4)
Chloride: 110 mEq/L — ABNORMAL HIGH (ref 98–109)
GLUCOSE: 86 mg/dL (ref 70–140)
Potassium: 4.2 mEq/L (ref 3.5–5.1)
Sodium: 146 mEq/L — ABNORMAL HIGH (ref 136–145)
Total Protein: 6.7 g/dL (ref 6.4–8.3)

## 2014-06-14 NOTE — Progress Notes (Signed)
ID: Angela [name] OB: [birth year]  MR#: [id number]  RVU#:[id number]  PCP: No primary provider on file. GYN:   SU: Osborn Coho OTHER MD: Tyler Pita  CHIEF COMPLAINT: right breast caner CURRENT TREATMENT: anastrozole 64m daily  BREAST CANCER HISTORY: From doctor Lauretta Odogwu's intake note 04/23/2007:  "The patient was recently performing a breast exam where she noted a nodule in the right lower quadrant of the right breast.  Dr. HJana Hakimreferred her for mammogram and ultrasound.  The mammogram and ultrasound performed on 03/15/2007 demonstrated a subcentimeter density in the lower inner quadrant of the right breast.  The density was a subcutaneous hyperechoic mass with irregular borders measuring 1 x 0.6 x 0.7 cm.  An ultrasound guided fine needle aspiration performed was consistent with an adenocarcinoma.  On 03/31/2007 the patient was referred for bilateral MRI of breasts.  The findings demonstrated a 1.3 x 1.2 x 1 cm with irregular margins in the lower inner aspect of the right breast corresponding to the subcutaneous mass in the 5 o'clock position that was previously biopsied.  There were no other masses seen and no abnormal appearing lymph nodes demonstrated.  The visualized chest wall was unremarkable.  On April 05, 2004 Dr. CNeldon Mcperformed a lumpectomy with sentinel node dissection.  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  The tumor was 1 cm from the resection margin.  One of two sentinel lymph node samples was positive for metastatic adenocarcinoma.  There was evidence of lymphovascular invasion.  The tumor was positive for estrogen receptor expression and negative for progesterone receptor expression.  The invasive tumor demonstrated an increased proliferation rate.  The tumor was positive for HER2/neu overexpression as mentioned by the HercepTest method. "  The patient's subsequent history is as detailed below   INTERVAL  HISTORY: Angela [name] returns today for follow up of her breast cancer. She has been on anastrozole since April 2009 and is tolerating it well. She denies hot flashes, vaginal changes, or arthralgias/myalgias.   REVIEW OF SYSTEMS: Angela [name] denies fevers, chills, nausea, vomiting, or changes in bowel or bladder habits. She has no shortness of breath, chest pain, cough, fatigue, or palpitations. She continues to have shooting breast pain and tenderness to her right breast. I explained that this is normal post surgery. She will try naproxen next time this pain occurs. She works out several times a week on her exercise bike. A detailed review of systems is otherwise noncontributory.    PAST MEDICAL HISTORY: Past Medical History  Diagnosis Date  . Arthritis   . Blindness     90%  . Glaucoma   . GERD (gastroesophageal reflux disease)   . Hyperlipidemia   . Irritable bowel syndrome (IBS)   . Mitral valve prolapse   . Herpes simplex type II infection 1999  . Myasthenia gravis     eyelids weakness.   . Breast cancer 03/19/2007    right  . Choledocholithiasis with obstruction 11/16/2012    PAST SURGICAL HISTORY: Past Surgical History  Procedure Laterality Date  . Tonsillectomy  73YEARS OLD  . Varicose vein surgery  70S  . Eye surgery  1993  . Knee surgery  2011  . Total abdominal hysterectomy w/ bilateral salpingoophorectomy    . Breast lumpectomy  04/06/2007    right  . Axillary node dissection  05/06/2007    right  . Ercp N/A 11/16/2012    Procedure: ENDOSCOPIC RETROGRADE CHOLANGIOPANCREATOGRAPHY (ERCP);  Surgeon: CGatha Mayer MD;  Location: [place] OR;  Service: Endoscopy;  Laterality: N/A;  . Ercp  11/16/2012  . Cholecystectomy N/A 11/18/2012    Procedure: LAPAROSCOPIC CHOLECYSTECTOMY WITH INTRAOPERATIVE CHOLANGIOGRAM;  Surgeon: Madilyn Hook, DO;  Location: [facility] OR;  Service: General;  Laterality: N/A;    FAMILY HISTORY Family History  Problem Relation Age of Onset  . Cancer Mother   .  Hypertension Sister    no family history of breast or ovarian cancer. There is a history of prostate cancel in second-degree relatives  GYNECOLOGIC HISTORY:  Menarche age 66, first child age 34, she is Angela [name]. She underwent hysterectomy with bilateral salpingo-oophorectomy in her 32s. She did not take hormone replacement  SOCIAL HISTORY:  The patient used to work in Scientist, clinical (histocompatibility and immunogenetics), but is now retired. Her husband Kerry Dory, used to work for [employer]. He has MS. The patient has 2 sons.    ADVANCED DIRECTIVES:    HEALTH MAINTENANCE: History  Substance Use Topics  . Smoking status: Never Smoker   . Smokeless tobacco: Never Used  . Alcohol Use: No     Colonoscopy:  PAP:  Bone density:  Lipid panel:  Allergies  Allergen Reactions  . Pravachol Rash    Current Outpatient Prescriptions  Medication Sig Dispense Refill  . anastrozole (ARIMIDEX) 1 MG tablet Take 1 tablet (1 mg total) by mouth daily.  90 tablet  12  . aspirin EC 81 MG tablet Take by mouth.      . Calcium Carbonate-Vitamin D (CALCIUM + D PO) Take 1 tablet by mouth 2 (two) times a week. Days of the week varies - holds for loose stools      . fish oil-omega-3 fatty acids 1000 MG capsule Take 1 capsule by mouth daily. Fish oil 1200 mg + Vit D 1000  Holds for loose stools      . LOTEMAX 0.5 % ophthalmic suspension Place 2 drops into the right eye 2 (two) times daily.       . Multiple Vitamin (MULTIVITAMIN WITH MINERALS) TABS Take 1 tablet by mouth daily.      Marland Kitchen omeprazole (PRILOSEC) 20 MG capsule Take 20 mg by mouth daily.       . timolol (TIMOPTIC) 0.5 % ophthalmic solution Place 1 drop into the right eye every evening.      . meclizine (ANTIVERT) 12.5 MG tablet Take 1 tablet (12.5 mg total) by mouth 3 (three) times daily as needed for dizziness or nausea.  15 tablet  0   No current facility-administered medications for this visit.    OBJECTIVE: Older African American woman who appears stated age  73 Vitals:    06/14/14 0924  BP: 124/77  Pulse: 69  Temp: 97.5 F (36.4 C)  Resp: 19     Body mass index is 34.59 kg/(m^2).    ECOG FS:1 - Symptomatic but completely ambulatory  Skin: warm, dry  HEENT: sclerae anicteric, conjunctivae pink, oropharynx clear. No thrush or mucositis.  Lymph Nodes: No cervical or supraclavicular lymphadenopathy  Lungs: clear to auscultation bilaterally, no rales, wheezes, or rhonci  Heart: regular rate and rhythm  Abdomen: round, soft, non tender, positive bowel sounds  Musculoskeletal: No focal spinal tenderness, no peripheral edema  Neuro: non focal, well oriented, positive affect  Breasts: right breast status post lumpectomy and radiation. No evidence of recurrent disease. Right axilla remarkable. Left breast unremarkable .   LAB RESULTS:  CMP     Component Value Date/Time   NA 146* 06/14/2014 0912   NA 140  04/02/2013 0650   K 4.2 06/14/2014 0912   K 3.7 04/02/2013 0650   CL 105 04/02/2013 0650   CL 107 12/11/2012 1104   CO2 27 06/14/2014 0912   CO2 25 04/02/2013 0650   GLUCOSE 86 06/14/2014 0912   GLUCOSE 95 04/02/2013 0650   GLUCOSE 117* 12/11/2012 1104   BUN 8.8 06/14/2014 0912   BUN 11 04/02/2013 0650   CREATININE 0.8 06/14/2014 0912   CREATININE 0.74 04/02/2013 0650   CALCIUM 9.5 06/14/2014 0912   CALCIUM 9.1 04/02/2013 0650   PROT 6.7 06/14/2014 0912   PROT 6.9 04/01/2013 1427   ALBUMIN 3.2* 06/14/2014 0912   ALBUMIN 3.5 04/01/2013 1427   AST 18 06/14/2014 0912   AST 20 04/01/2013 1427   ALT 21 06/14/2014 0912   ALT 23 04/01/2013 1427   ALKPHOS 78 06/14/2014 0912   ALKPHOS 75 04/01/2013 1427   BILITOT 0.32 06/14/2014 0912   BILITOT 0.3 04/01/2013 1427   GFRNONAA 84* 04/02/2013 0650   GFRAA >90 04/02/2013 0650    I No results found for this basename: SPEP,  UPEP,   kappa and lambda light chains    Lab Results  Component Value Date   WBC 3.8* 06/14/2014   NEUTROABS 1.6 06/14/2014   HGB 13.2 06/14/2014   HCT 40.9 06/14/2014   MCV 91.9 06/14/2014   PLT 210  06/14/2014      Chemistry      Component Value Date/Time   NA 146* 06/14/2014 0912   NA 140 04/02/2013 0650   K 4.2 06/14/2014 0912   K 3.7 04/02/2013 0650   CL 105 04/02/2013 0650   CL 107 12/11/2012 1104   CO2 27 06/14/2014 0912   CO2 25 04/02/2013 0650   BUN 8.8 06/14/2014 0912   BUN 11 04/02/2013 0650   CREATININE 0.8 06/14/2014 0912   CREATININE 0.74 04/02/2013 0650      Component Value Date/Time   CALCIUM 9.5 06/14/2014 0912   CALCIUM 9.1 04/02/2013 0650   ALKPHOS 78 06/14/2014 0912   ALKPHOS 75 04/01/2013 1427   AST 18 06/14/2014 0912   AST 20 04/01/2013 1427   ALT 21 06/14/2014 0912   ALT 23 04/01/2013 1427   BILITOT 0.32 06/14/2014 0912   BILITOT 0.3 04/01/2013 1427       Lab Results  Component Value Date   LABCA2 23 12/11/2012    No components found with this basename: [id number]    No results found for this basename: INR,  in the last 168 hours  Urinalysis    Component Value Date/Time   COLORURINE YELLOW 04/01/2013 1812   APPEARANCEUR TURBID* 04/01/2013 1812   LABSPEC 1.023 04/01/2013 1812   PHURINE 6.5 04/01/2013 1812   GLUCOSEU NEGATIVE 04/01/2013 1812   HGBUR SMALL* 04/01/2013 1812   BILIRUBINUR NEGATIVE 04/01/2013 1812   KETONESUR NEGATIVE 04/01/2013 1812   PROTEINUR NEGATIVE 04/01/2013 1812   UROBILINOGEN 0.2 04/01/2013 1812   NITRITE NEGATIVE 04/01/2013 1812   LEUKOCYTESUR LARGE* 04/01/2013 1812    STUDIES: No results found.  ASSESSMENT: 73 y.o. [place] woman status post right lumpectomy and sentinel lymph node sampling 04/06/2007 for a pT1c, pN1, stage IIA invasive ductal carcinoma, grade 2, estrogen receptor 55% positive, progesterone receptor negative, with an MIB-1 of 35%, and HercepTest amplified at 3+.  1. On 05/06/2007 she underwent right axillary lymph node dissection with an additional 14 lymph nodes benign, so the patient had a total of 2/16 lymph nodes positive.  2. She enrolled in the [facility]  trial and received docetaxel, carboplatin, bevacizumab and  trastuzumab through January of 2009. [The trastuzumab and bevacizumab were continued through September of 2009].  3 completed radiation to the right breast April 2009  3. She started anastrozole April of 2009, and has opted to continue to a total of 10 years  4. patient is legally blind  PLAN: Lyrika continues to do well. The labs were reviewed in detail and were stable. She is now seven years out from her definitive surgery with no evidence of recurrent disease. She is tolerating the anastrozole well and obtains it at a good price. The plan is to continue the drug for a total of 10 years.   Angela [name] claims to have had a mammogram in August of this year at [facility], so I will obtain these records for her file. She is scheduled for a bone density scan this Thursday.   Angela [name] will return next year for labs and a follow up visit. She understands and agrees with the plan. She knows the goal of treatment in her case is cure. She has been encouraged to call with any issues that might arise before her next visit here.    Marcelino Duster, NP   06/14/2014 10:40 AM

## 2014-06-14 NOTE — Telephone Encounter (Signed)
per pof to sch pt appt-gave pt copy of sch °

## 2014-06-14 NOTE — Telephone Encounter (Signed)
per pof to sch pt appt-sch & gave pt copy of sch °

## 2014-11-09 ENCOUNTER — Telehealth: Payer: Self-pay | Admitting: *Deleted

## 2014-11-09 NOTE — Telephone Encounter (Signed)
11/09/2014 Received telephone call from this former clinical trial participant who states that she did not know when her next follow-up appointment was due. Per chart review, patient was seen by Susanne Borders in October 2015, and has upcoming appointments for lab and MD visit with Dr. Jana Hakim on 06/20/2015. Per patient's request, will mail a copy of the appointment calendar along with Dr. Virgie Dad business card to patient. Patient also notes that she is scheduled to see a Dr. Zigmund Daniel from [facility] on 11/23/2014 for follow-up regarding a possible bladder tack procedures. Cindy S. Brigitte Pulse BSN, RN, [place] 11/09/2014 9:26 AM

## 2015-06-20 ENCOUNTER — Other Ambulatory Visit (HOSPITAL_BASED_OUTPATIENT_CLINIC_OR_DEPARTMENT_OTHER): Payer: Medicare Other

## 2015-06-20 ENCOUNTER — Ambulatory Visit (HOSPITAL_BASED_OUTPATIENT_CLINIC_OR_DEPARTMENT_OTHER): Payer: Medicare Other | Admitting: Oncology

## 2015-06-20 VITALS — BP 114/74 | HR 64 | Temp 98.1°F | Resp 18 | Ht 64.0 in | Wt 203.8 lb

## 2015-06-20 DIAGNOSIS — C50311 Malignant neoplasm of lower-inner quadrant of right female breast: Secondary | ICD-10-CM

## 2015-06-20 DIAGNOSIS — Z170 Estrogen receptor positive status [ER+]: Secondary | ICD-10-CM

## 2015-06-20 LAB — COMPREHENSIVE METABOLIC PANEL (CC13)
ALT: 16 U/L (ref 0–55)
AST: 16 U/L (ref 5–34)
Albumin: 3.2 g/dL — ABNORMAL LOW (ref 3.5–5.0)
Alkaline Phosphatase: 73 U/L (ref 40–150)
Anion Gap: 7 mEq/L (ref 3–11)
BILIRUBIN TOTAL: 0.4 mg/dL (ref 0.20–1.20)
BUN: 13 mg/dL (ref 7.0–26.0)
CO2: 27 meq/L (ref 22–29)
CREATININE: 0.8 mg/dL (ref 0.6–1.1)
Calcium: 9.2 mg/dL (ref 8.4–10.4)
Chloride: 109 mEq/L (ref 98–109)
EGFR: 88 mL/min/{1.73_m2} — ABNORMAL LOW (ref >90)
Glucose: 92 mg/dl (ref 70–140)
Potassium: 4 mEq/L (ref 3.5–5.1)
SODIUM: 143 meq/L (ref 136–145)
TOTAL PROTEIN: 6.3 g/dL — AB (ref 6.4–8.3)

## 2015-06-20 LAB — CBC WITH DIFFERENTIAL/PLATELET
BASO%: 0.6 % (ref 0.0–2.0)
BASOS ABS: 0 10*3/uL (ref 0.0–0.1)
EOS%: 3.1 % (ref 0.0–7.0)
Eosinophils Absolute: 0.1 10*3/uL (ref 0.0–0.5)
HCT: 40.1 % (ref 34.8–46.6)
HEMOGLOBIN: 13.1 g/dL (ref 11.6–15.9)
LYMPH%: 46.5 % (ref 14.0–49.7)
MCH: 29.8 pg (ref 25.1–34.0)
MCHC: 32.7 g/dL (ref 31.5–36.0)
MCV: 91.3 fL (ref 79.5–101.0)
MONO#: 0.3 10*3/uL (ref 0.1–0.9)
MONO%: 7.3 % (ref 0.0–14.0)
NEUT#: 1.5 10*3/uL (ref 1.5–6.5)
NEUT%: 42.5 % (ref 38.4–76.8)
Platelets: 197 10*3/uL (ref 145–400)
RBC: 4.39 10*6/uL (ref 3.70–5.45)
RDW: 14.2 % (ref 11.2–14.5)
WBC: 3.6 10*3/uL — ABNORMAL LOW (ref 3.9–10.3)
lymph#: 1.7 10*3/uL (ref 0.9–3.3)

## 2015-06-20 MED ORDER — ANASTROZOLE 1 MG PO TABS: 1.0000 mg | ORAL_TABLET | Freq: Every day | ORAL | Status: DC

## 2015-06-20 NOTE — Progress Notes (Signed)
ID: Angela Angela [name] OB: Feb 13, 1941  MR#: [id number]  TMA#:[id number]  PCP: PROVIDER NOT IN SYSTEM GYN:   SU: Angela Angela [name] OTHER MD: Angela Angela [name]  CHIEF COMPLAINT: estrogen receptor positive breast cancer  CURRENT TREATMENT: anastrozole   BREAST CANCER HISTORY: From doctor Angela Angela [name]'s intake note 04/23/2007:  "The patient was recently performing a breast exam where she noted a nodule in the right lower quadrant of the right breast.  Dr. Jana [name] referred her for mammogram and ultrasound.  The mammogram and ultrasound performed on 03/15/2007 demonstrated a subcentimeter density in the lower inner quadrant of the right breast.  The density was a subcutaneous hyperechoic mass with irregular borders measuring 1 x 0.6 x 0.7 cm.  An ultrasound guided fine needle aspiration performed was consistent with an adenocarcinoma.  On 03/31/2007 the patient was referred for bilateral MRI of breasts.  The findings demonstrated a 1.3 x 1.2 x 1 cm with irregular margins in the lower inner aspect of the right breast corresponding to the subcutaneous mass in the 5 o'clock position that was previously biopsied.  There were no other masses seen and no abnormal appearing lymph nodes demonstrated.  The visualized chest wall was unremarkable.  On April 05, 2004 Angela Angela [name] performed a lumpectomy with sentinel node dissection.  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  The tumor was 1 cm from the resection margin.  One of two sentinel lymph node samples was positive for metastatic adenocarcinoma.  There was evidence of lymphovascular invasion.  The tumor was positive for estrogen receptor expression and negative for progesterone receptor expression.  The invasive tumor demonstrated an increased proliferation rate.  The tumor was positive for HER2/neu overexpression as mentioned by the HercepTest method. "  The patient's subsequent history Angela [name]   INTERVAL  HISTORY: Angela Angela [name] for follow up of her breast cancer. She continues on anastrozole, which she tolerates Well. She denies hot flashes, night sweats, vaginal dryness issues, or arthralgias myalgias. She obtains it at $18 for 3 month supply.  REVIEW OF SYSTEMS: She has mild seasonal allergies. Occasionally she has leg cramps, especially at night. Her vision can be blurred at times. She has a little bit of a runny nose. She sleeps on 3 pillows. Her main concern of course Angela Angela [name] who has severe MS and she has to help with many of his activities of daily living. 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 There recently had a family tragedy in her daughter-in-law. This Angela [name] cause some stress. Aside from these issues a review of systems [name] was stable  PAST MEDICAL HISTORY: Past Medical History  Diagnosis Date  . Arthritis   . Blindness     90%  . Glaucoma   . GERD (gastroesophageal reflux disease)   . Hyperlipidemia   . Irritable bowel syndrome (IBS)   . Mitral valve prolapse   . Herpes simplex type II infection 1999  . Myasthenia gravis     eyelids weakness.   . Breast cancer 03/19/2007    right  . Choledocholithiasis with obstruction 11/16/2012    PAST SURGICAL HISTORY: Past Surgical History  Procedure Laterality Date  . Tonsillectomy  74 YEARS OLD  . Varicose vein surgery  [facility]  . Eye surgery  1993  . Knee surgery  2011  . Total abdominal hysterectomy w/ bilateral salpingoophorectomy    . Breast lumpectomy  04/06/2007  right  . Axillary node dissection  05/06/2007    right  . Ercp N/A 11/16/2012    Procedure: ENDOSCOPIC RETROGRADE CHOLANGIOPANCREATOGRAPHY (ERCP);  Surgeon: Angela Mayer, MD;  Location: [facility];  Service: Endoscopy;  Laterality: N/A;  . Ercp  11/16/2012  . Cholecystectomy N/A 11/18/2012    Procedure: LAPAROSCOPIC CHOLECYSTECTOMY WITH INTRAOPERATIVE CHOLANGIOGRAM;  Surgeon: Angela Hook, DO;   Location: [name];  Service: General;  Laterality: N/A;    FAMILY HISTORY Family History  Problem Relation Age of Onset  . Cancer Mother   . Hypertension Sister    no family history of breast or ovarian cancer. There Angela [name] a history of prostate cancel in second-degree relatives  GYNECOLOGIC HISTORY:  Menarche age 89, first child age 13, she Angela [name] [place] P2. She underwent hysterectomy with bilateral salpingo-oophorectomy in her 68s. She did not take hormone replacement  SOCIAL HISTORY:  The patient used to work in Scientist, clinical (histocompatibility and immunogenetics), but Angela [name] now retired. Her [name] Angela Angela [name], used to work for [employer]. He has MS. The patient has 2 sons.    ADVANCED DIRECTIVES: not in place   HEALTH MAINTENANCE: Social History  Substance Use Topics  . Smoking status: Never Smoker   . Smokeless tobacco: Never Used  . Alcohol Use: No     Colonoscopy:  PAP:  Bone density:  Lipid panel:  Allergies  Allergen Reactions  . Pravachol Rash    Current Outpatient Prescriptions  Medication Sig Dispense Refill  . anastrozole (ARIMIDEX) 1 MG tablet Take 1 tablet (1 mg total) by mouth daily. 90 tablet 12  . aspirin EC 81 MG tablet Take by mouth.    . Calcium Carbonate-Vitamin D (CALCIUM + D PO) Take 1 tablet by mouth 2 (two) times a week. Days of the week varies - holds for loose stools    . fish oil-omega-3 fatty acids 1000 MG capsule Take 1 capsule by mouth daily. Fish oil 1200 mg + Vit D 1000  Holds for loose stools    . LOTEMAX 0.5 % ophthalmic suspension Place 2 drops into the right eye 2 (two) times daily.     . meclizine (ANTIVERT) 12.5 MG tablet Take 1 tablet (12.5 mg total) by mouth 3 (three) times daily as needed for dizziness or nausea. 15 tablet 0  . Multiple Vitamin (MULTIVITAMIN WITH MINERALS) TABS Take 1 tablet by mouth daily.    Marland Kitchen omeprazole (PRILOSEC) 20 MG capsule Take 20 mg by mouth daily.     . timolol (TIMOPTIC) 0.5 % ophthalmic solution Place 1 drop into the right eye every evening.     No  current facility-administered medications for this visit.    OBJECTIVE: Older African American woman in no acute distress Filed Vitals:   06/20/15 1228  BP: 114/74  Pulse: 64  Temp: 98.1 F (36.7 C)  Resp: 18     Body mass index [name] 34.96 kg/(m^2).    ECOG FS:1 - Symptomatic but completely ambulatory  Sclerae unicteric, the patient Angela [name] legally blind Oropharynx clear and moist-- no thrush or other lesions No cervical or supraclavicular adenopathy Lungs no rales or rhonchi Heart regular rate and rhythm Abd soft, nontender, positive bowel sounds MSK no focal spinal tenderness, no upper extremity lymphedema Neuro: nonfocal, well oriented, appropriate affect Breasts: the right breast Angela [name] status post lumpectomy and radiation. There Angela [name] no evidence of local recurrence. The right axilla Angela [name] benign. The left breast [name] unremarkable.   LAB RESULTS:  CMP     Component Value Date/Time  NA 143 06/20/2015 1124   NA 140 04/02/2013 0650   K 4.0 06/20/2015 1124   K 3.7 04/02/2013 0650   CL 105 04/02/2013 0650   CL 107 12/11/2012 1104   CO2 27 06/20/2015 1124   CO2 25 04/02/2013 0650   GLUCOSE 92 06/20/2015 1124   GLUCOSE 95 04/02/2013 0650   GLUCOSE 117* 12/11/2012 1104   BUN 13.0 06/20/2015 1124   BUN 11 04/02/2013 0650   CREATININE 0.8 06/20/2015 1124   CREATININE 0.74 04/02/2013 0650   CALCIUM 9.2 06/20/2015 1124   CALCIUM 9.1 04/02/2013 0650   PROT 6.3* 06/20/2015 1124   PROT 6.9 04/01/2013 1427   ALBUMIN 3.2* 06/20/2015 1124   ALBUMIN 3.5 04/01/2013 1427   AST 16 06/20/2015 1124   AST 20 04/01/2013 1427   ALT 16 06/20/2015 1124   ALT 23 04/01/2013 1427   ALKPHOS 73 06/20/2015 1124   ALKPHOS 75 04/01/2013 1427   BILITOT 0.40 06/20/2015 1124   BILITOT 0.3 04/01/2013 1427   GFRNONAA 84* 04/02/2013 0650   GFRAA >90 04/02/2013 0650    I No results found for: SPEP  Lab Results  Component Value Date   WBC 3.6* 06/20/2015   NEUTROABS 1.5 06/20/2015   HGB 13.1 06/20/2015    HCT 40.1 06/20/2015   MCV 91.3 06/20/2015   PLT 197 06/20/2015      Chemistry      Component Value Date/Time   NA 143 06/20/2015 1124   NA 140 04/02/2013 0650   K 4.0 06/20/2015 1124   K 3.7 04/02/2013 0650   CL 105 04/02/2013 0650   CL 107 12/11/2012 1104   CO2 27 06/20/2015 1124   CO2 25 04/02/2013 0650   BUN 13.0 06/20/2015 1124   BUN 11 04/02/2013 0650   CREATININE 0.8 06/20/2015 1124   CREATININE 0.74 04/02/2013 0650      Component Value Date/Time   CALCIUM 9.2 06/20/2015 1124   CALCIUM 9.1 04/02/2013 0650   ALKPHOS 73 06/20/2015 1124   ALKPHOS 75 04/01/2013 1427   AST 16 06/20/2015 1124   AST 20 04/01/2013 1427   ALT 16 06/20/2015 1124   ALT 23 04/01/2013 1427   BILITOT 0.40 06/20/2015 1124   BILITOT 0.3 04/01/2013 1427       Lab Results  Component Value Date   LABCA2 23 12/11/2012    No components found for: [id number]  No results for input(s): INR in the last 168 hours.  Urinalysis    Component Value Date/Time   COLORURINE YELLOW 04/01/2013 1812   APPEARANCEUR TURBID* 04/01/2013 1812   LABSPEC 1.023 04/01/2013 1812   PHURINE 6.5 04/01/2013 1812   GLUCOSEU NEGATIVE 04/01/2013 1812   HGBUR SMALL* 04/01/2013 1812   BILIRUBINUR NEGATIVE 04/01/2013 1812   KETONESUR NEGATIVE 04/01/2013 1812   PROTEINUR NEGATIVE 04/01/2013 1812   UROBILINOGEN 0.2 04/01/2013 1812   NITRITE NEGATIVE 04/01/2013 1812   LEUKOCYTESUR LARGE* 04/01/2013 1812    STUDIES: We have obtained the results from [facility] of her mammography on the right 10/11/2014 and her bone density 06/16/2014 which showed a T score of -1.9.  ASSESSMENT: 74 y.o. [place] woman status post right lumpectomy and sentinel lymph node sampling 04/06/2007 for a pT1c, pN1, stage IIA invasive ductal carcinoma, grade 2, estrogen receptor 55% positive, progesterone receptor negative, with an MIB-1 of 35%, and HercepTest amplified at 3+.  1. On 05/06/2007 she underwent right axillary lymph node dissection with an  additional 14 lymph nodes benign, so the patient had a total  of 2/16 lymph nodes positive.  2. She enrolled in the [facility] trial and received docetaxel, carboplatin, bevacizumab and trastuzumab through January of 2009. [The trastuzumab and bevacizumab were continued through September of 2009].  3 completed radiation to the right breast April 2009  3. She started anastrozole April of 2009, and we will continue thatto a total of 10 years  (a) osteopenia, with a T score of -1.9 on bone density scan October 2015   4. patient Angela [name] legally blind  PLAN: Angela Angela [name] now 8 years out from her definitive surgery with no evidence of disease recurrence. This Angela [name] very favorable.  She tolerates the anastrozole well, with no significant hot flashes or night sweats and obtains it at a good price. The plan Angela [name] to continue that for 2 additional years.at that point she will graduate from follow-up  We discussed osteopenia, and I am starting her on vitamin D and 1000 units daily. She likely Angela [name] obtaining enough calcium through her regular diet. She will need a repeat bone density October 2017.  She knows to call for any problems that may develop before her next visit here.  Chauncey Cruel, MD   06/20/2015 12:48 PM

## 2015-06-25 MED ORDER — VITAMIN D 1000 UNITS PO TABS: 1000.0000 [IU] | ORAL_TABLET | Freq: Every day | ORAL | Status: AC

## 2015-07-29 ENCOUNTER — Encounter (HOSPITAL_COMMUNITY): Payer: Self-pay | Admitting: *Deleted

## 2015-07-29 ENCOUNTER — Emergency Department (HOSPITAL_COMMUNITY): Payer: Medicare Other

## 2015-07-29 ENCOUNTER — Emergency Department (HOSPITAL_COMMUNITY)
Admission: EM | Admit: 2015-07-29 | Discharge: 2015-07-29 | Disposition: A | Discharge status: Home / Self Care | Payer: Medicare Other | Attending: Emergency Medicine | Admitting: Emergency Medicine

## 2015-07-29 DIAGNOSIS — M546 Pain in thoracic spine: Secondary | ICD-10-CM | POA: Diagnosis not present

## 2015-07-29 DIAGNOSIS — M1990 Unspecified osteoarthritis, unspecified site: Secondary | ICD-10-CM | POA: Diagnosis not present

## 2015-07-29 DIAGNOSIS — H409 Unspecified glaucoma: Secondary | ICD-10-CM | POA: Diagnosis not present

## 2015-07-29 DIAGNOSIS — H540 Blindness, both eyes: Secondary | ICD-10-CM | POA: Insufficient documentation

## 2015-07-29 DIAGNOSIS — Z8619 Personal history of other infectious and parasitic diseases: Secondary | ICD-10-CM | POA: Diagnosis not present

## 2015-07-29 DIAGNOSIS — K219 Gastro-esophageal reflux disease without esophagitis: Secondary | ICD-10-CM | POA: Diagnosis not present

## 2015-07-29 DIAGNOSIS — Z7982 Long term (current) use of aspirin: Secondary | ICD-10-CM | POA: Insufficient documentation

## 2015-07-29 DIAGNOSIS — Z853 Personal history of malignant neoplasm of breast: Secondary | ICD-10-CM | POA: Insufficient documentation

## 2015-07-29 DIAGNOSIS — E785 Hyperlipidemia, unspecified: Secondary | ICD-10-CM | POA: Diagnosis not present

## 2015-07-29 DIAGNOSIS — R05 Cough: Secondary | ICD-10-CM | POA: Insufficient documentation

## 2015-07-29 DIAGNOSIS — Z79899 Other long term (current) drug therapy: Secondary | ICD-10-CM | POA: Insufficient documentation

## 2015-07-29 DIAGNOSIS — Z8679 Personal history of other diseases of the circulatory system: Secondary | ICD-10-CM | POA: Diagnosis not present

## 2015-07-29 MED ORDER — ACETAMINOPHEN 500 MG PO TABS
1000.0000 mg | ORAL_TABLET | Freq: Once | ORAL | Status: AC
  Administered 2015-07-29: 1000 mg via ORAL
  Filled 2015-07-29: qty 2

## 2015-07-29 NOTE — ED Provider Notes (Signed)
CSN: [id number]     Arrival date & time 07/29/15  0935 History   First MD Initiated Contact with Patient 07/29/15 [phone]     Chief Complaint  Patient presents with  . Back Pain     (Consider location/radiation/quality/duration/timing/severity/associated sxs/prior Treatment) HPI Comments: 74yo F w/ PMH including breast CA, GERD, myasthenia gravis who presents with back pain. Patient states that for the past 5 days she has had right-sided upper thoracic back pain that goes down the right side of her back. The pain is moderate in intensity and worse with movement. She denies any recent change in physical activity or recent trauma. She has had a mild cough this week but denies any associated fevers, vomiting, or abdominal pain. No neck pain or headache. No bowel/bladder incontinence, saddle anesthesia, or problems walking. She has not yet taken any medication.  Patient is a 74 y.o. female presenting with back pain. The history is provided by the patient.  Back Pain   Past Medical History  Diagnosis Date  . Arthritis   . Blindness     90%  . Glaucoma   . GERD (gastroesophageal reflux disease)   . Hyperlipidemia   . Irritable bowel syndrome (IBS)   . Mitral valve prolapse   . Herpes simplex type II infection 1999  . Myasthenia gravis     eyelids weakness.   . Breast cancer ([place]) 03/19/2007    right  . Choledocholithiasis with obstruction 11/16/2012   Past Surgical History  Procedure Laterality Date  . Tonsillectomy  74 YEARS OLD  . Varicose vein surgery  [facility]  . Eye surgery  1993  . Knee surgery  2011  . Total abdominal hysterectomy w/ bilateral salpingoophorectomy    . Breast lumpectomy  04/06/2007    right  . Axillary node dissection  05/06/2007    right  . Ercp N/A 11/16/2012    Procedure: ENDOSCOPIC RETROGRADE CHOLANGIOPANCREATOGRAPHY (ERCP);  Surgeon: Gatha Mayer, MD;  Location: [facility];  Service: Endoscopy;  Laterality: N/A;  . Ercp  11/16/2012  . Cholecystectomy N/A 11/18/2012     Procedure: LAPAROSCOPIC CHOLECYSTECTOMY WITH INTRAOPERATIVE CHOLANGIOGRAM;  Surgeon: Madilyn Hook, DO;  Location: [facility];  Service: General;  Laterality: N/A;   Family History  Problem Relation Age of Onset  . Cancer Mother   . Hypertension Sister    Social History  Substance Use Topics  . Smoking status: Never Smoker   . Smokeless tobacco: Never Used  . Alcohol Use: No   OB History    No data available     Review of Systems  Musculoskeletal: Positive for back pain.    10 Systems reviewed and are negative for acute change except as noted in the HPI.   Allergies  Pravachol  Home Medications   Prior to Admission medications   Medication Sig Start Date End Date Taking? Authorizing Provider  anastrozole (ARIMIDEX) 1 MG tablet Take 1 tablet (1 mg total) by mouth daily. 06/20/15  Yes Chauncey Cruel, MD  aspirin EC 81 MG tablet Take 81 mg by mouth daily.    Yes Historical Provider, MD  cholecalciferol (VITAMIN D) 1000 UNITS tablet Take 1 tablet (1,000 Units total) by mouth daily. 06/25/15  Yes Chauncey Cruel, MD  fish oil-omega-3 fatty acids 1000 MG capsule Take 1 capsule by mouth daily. Fish oil 1200 mg + Vit D 1000  Holds for loose stools 04/03/08  Yes Historical Provider, MD  LOTEMAX 0.5 % ophthalmic suspension Place 1 drop into the  right eye 2 (two) times daily.  03/29/11  Yes Historical Provider, MD  Multiple Vitamin (MULTIVITAMIN WITH MINERALS) TABS Take 1 tablet by mouth daily.   Yes Historical Provider, MD  omeprazole (PRILOSEC) 20 MG capsule Take 20 mg by mouth daily.  03/07/11  Yes Historical Provider, MD  timolol (TIMOPTIC) 0.5 % ophthalmic solution Place 1 drop into the right eye every evening.   Yes Historical Provider, MD   BP 134/87 mmHg  Pulse 69  Temp(Src) 98 F (36.7 C) (Oral)  Resp 16  SpO2 100% Physical Exam  Constitutional: She is oriented to person, place, and time. She appears well-developed and well-nourished. No distress.  HENT:  Head:  Normocephalic and atraumatic.  Moist mucous membranes  Eyes: Conjunctivae are normal. Pupils are equal, round, and reactive to light.  Neck: Neck supple.  Cardiovascular: Normal rate, regular rhythm and normal heart sounds.   No murmur heard. Pulmonary/Chest: Effort normal and breath sounds normal.  Abdominal: Soft. Bowel sounds are normal. She exhibits no distension. There is no tenderness.  Musculoskeletal: She exhibits no edema.  R thoracic paraspinal muscle tenderness; no midline spinal tenderness or warmth; 5/5 strength and normal sensation x all 4 ext  Neurological: She is alert and oriented to person, place, and time.  Fluent speech  Skin: Skin is warm and dry. No rash noted. No erythema.  Psychiatric: She has a normal mood and affect. Judgment normal.  Nursing note and vitals reviewed.   ED Course  Procedures (including critical care time) Labs Review Labs Reviewed - No data to display  Imaging Review Dg Chest 2 View  07/29/2015  CLINICAL DATA:  Right upper back and posterior chest pain for 6 days. History of right breast cancer. EXAM: CHEST  2 VIEW COMPARISON:  04/01/2013 FINDINGS: Surgical clips in the right chest. Both lungs are clear. Heart and mediastinum are within normal limits and stable. The trachea is midline. Both lungs are clear. Linear densities in the anterior chest may represent areas of scarring. No pleural effusions. IMPRESSION: No active cardiopulmonary disease. Electronically Signed   By: Markus Daft M.D.   On: 07/29/2015 11:28   Dg Thoracic Spine 2 View  07/29/2015  CLINICAL DATA:  Right-sided thoracic back pain. EXAM: THORACIC SPINE 2 VIEWS COMPARISON:  Chest radiograph 07/29/2015 and CT 04/30/2007 FINDINGS: Bridging right osteophytes in the lower thoracic spine. Visualized ribs are intact. Surgical clips in the right upper abdomen. The thoracic vertebral bodies are maintained. No evidence for thoracic vertebral body fracture. Alignment of the thoracic spine  is within normal limits. Degenerative endplate changes at N4-B0 and C4-C5. IMPRESSION: No acute bone abnormality in the thoracic spine. Electronically Signed   By: Markus Daft M.D.   On: 07/29/2015 11:26    Medications  acetaminophen (TYLENOL) tablet 1,000 mg (1,000 mg Oral Given 07/29/15 1039)    MDM   Final diagnoses:  Right-sided thoracic back pain   Pt p/w 5 days of R thoracic back pain and mild cough. Patient well-appearing on exam with normal vital signs. Normal neurologic exam. She had right paraspinal muscle tenderness in thoracic region. Because of history of breast cancer, obtained x-ray of thoracic spine as well as chest x-ray given cough. Gave the patient Tylenol.  Plain films negative. Given the location of the pain over her paraspinal and trapezius muscles, I suspect musculoskeletal etiology. Discussed supportive care including heating pad without sleeping on it, Tylenol, and early range of motion. Discussed follow-up with PCP in one week if not improved.  Return precautions reviewed. Patient voiced understanding and was discharged in satisfactory condition.  Sharlett Iles, MD 07/29/15 [phone]

## 2015-07-29 NOTE — ED Notes (Signed)
Pt c/o right upper back pain, since Monday, also reports "little bit of coughing", sts pain is worse with movement

## 2015-07-29 NOTE — Discharge Instructions (Signed)

## 2015-09-25 ENCOUNTER — Other Ambulatory Visit: Payer: Self-pay | Admitting: Ophthalmology

## 2016-02-06 ENCOUNTER — Encounter (HOSPITAL_COMMUNITY): Payer: Self-pay

## 2016-02-06 ENCOUNTER — Emergency Department (HOSPITAL_COMMUNITY): Payer: No Typology Code available for payment source

## 2016-02-06 ENCOUNTER — Emergency Department (HOSPITAL_COMMUNITY)
Admission: EM | Admit: 2016-02-06 | Discharge: 2016-02-06 | Disposition: A | Discharge status: Home / Self Care | Payer: No Typology Code available for payment source | Attending: Emergency Medicine | Admitting: Emergency Medicine

## 2016-02-06 DIAGNOSIS — M1990 Unspecified osteoarthritis, unspecified site: Secondary | ICD-10-CM | POA: Diagnosis not present

## 2016-02-06 DIAGNOSIS — Z853 Personal history of malignant neoplasm of breast: Secondary | ICD-10-CM | POA: Diagnosis not present

## 2016-02-06 DIAGNOSIS — S8010XA Contusion of unspecified lower leg, initial encounter: Secondary | ICD-10-CM

## 2016-02-06 DIAGNOSIS — S2002XA Contusion of left breast, initial encounter: Secondary | ICD-10-CM

## 2016-02-06 DIAGNOSIS — Y999 Unspecified external cause status: Secondary | ICD-10-CM | POA: Insufficient documentation

## 2016-02-06 DIAGNOSIS — Z7982 Long term (current) use of aspirin: Secondary | ICD-10-CM | POA: Insufficient documentation

## 2016-02-06 DIAGNOSIS — Y92410 Unspecified street and highway as the place of occurrence of the external cause: Secondary | ICD-10-CM | POA: Insufficient documentation

## 2016-02-06 DIAGNOSIS — S8012XA Contusion of left lower leg, initial encounter: Secondary | ICD-10-CM | POA: Diagnosis present

## 2016-02-06 DIAGNOSIS — Y939 Activity, unspecified: Secondary | ICD-10-CM | POA: Diagnosis not present

## 2016-02-06 MED ORDER — HYDROCODONE-ACETAMINOPHEN 5-325 MG PO TABS
1.0000 | ORAL_TABLET | Freq: Once | ORAL | Status: AC
  Administered 2016-02-06: 1 via ORAL
  Filled 2016-02-06: qty 1

## 2016-02-06 MED ORDER — HYDROCODONE-ACETAMINOPHEN 5-325 MG PO TABS: 1.0000 | ORAL_TABLET | Freq: Four times a day (QID) | ORAL | Status: DC | PRN
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
## 2016-02-06 NOTE — ED Notes (Signed)
Patient was alert, oriented and stable upon discharge. RN went over AVS and patient had no further questions.  
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
## 2016-02-17 ENCOUNTER — Emergency Department (HOSPITAL_COMMUNITY): Payer: No Typology Code available for payment source

## 2016-02-17 ENCOUNTER — Emergency Department (HOSPITAL_COMMUNITY)
Admission: EM | Admit: 2016-02-17 | Discharge: 2016-02-17 | Disposition: A | Discharge status: Home / Self Care | Payer: No Typology Code available for payment source | Attending: Emergency Medicine | Admitting: Emergency Medicine

## 2016-02-17 ENCOUNTER — Encounter (HOSPITAL_COMMUNITY): Payer: Self-pay

## 2016-02-17 DIAGNOSIS — Y999 Unspecified external cause status: Secondary | ICD-10-CM | POA: Diagnosis not present

## 2016-02-17 DIAGNOSIS — Y939 Activity, unspecified: Secondary | ICD-10-CM | POA: Diagnosis not present

## 2016-02-17 DIAGNOSIS — Y92410 Unspecified street and highway as the place of occurrence of the external cause: Secondary | ICD-10-CM | POA: Diagnosis not present

## 2016-02-17 DIAGNOSIS — N6489 Other specified disorders of breast: Secondary | ICD-10-CM

## 2016-02-17 DIAGNOSIS — Z79899 Other long term (current) drug therapy: Secondary | ICD-10-CM | POA: Diagnosis not present

## 2016-02-17 DIAGNOSIS — Z853 Personal history of malignant neoplasm of breast: Secondary | ICD-10-CM | POA: Diagnosis not present

## 2016-02-17 DIAGNOSIS — M1990 Unspecified osteoarthritis, unspecified site: Secondary | ICD-10-CM | POA: Insufficient documentation

## 2016-02-17 DIAGNOSIS — S2002XA Contusion of left breast, initial encounter: Secondary | ICD-10-CM | POA: Insufficient documentation

## 2016-02-17 DIAGNOSIS — Z7982 Long term (current) use of aspirin: Secondary | ICD-10-CM | POA: Insufficient documentation

## 2016-02-17 DIAGNOSIS — E785 Hyperlipidemia, unspecified: Secondary | ICD-10-CM | POA: Insufficient documentation

## 2016-02-17 DIAGNOSIS — N644 Mastodynia: Secondary | ICD-10-CM | POA: Diagnosis present

## 2016-02-17 MED ORDER — HYDROCODONE-ACETAMINOPHEN 5-325 MG PO TABS
1.0000 | ORAL_TABLET | Freq: Once | ORAL | Status: AC
  Administered 2016-02-17: 1 via ORAL
  Filled 2016-02-17: qty 1

## 2016-02-17 NOTE — Discharge Instructions (Signed)
Your exam and x-rays were reassuring. You may fill your previous prescription for pain medicine. Follow-up with your doctor as needed. Return to ED for new or worsening symptoms.
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
## 2016-06-14 ENCOUNTER — Telehealth: Payer: Self-pay | Admitting: Oncology

## 2016-06-14 NOTE — Telephone Encounter (Signed)
Moved 10/18 lab/HB to 10/24 with [place]. Spoke with patient she is aware.

## 2016-06-16 ENCOUNTER — Emergency Department (HOSPITAL_COMMUNITY): Payer: Medicare Other

## 2016-06-16 ENCOUNTER — Encounter (HOSPITAL_COMMUNITY): Payer: Self-pay | Admitting: Emergency Medicine

## 2016-06-16 ENCOUNTER — Emergency Department (HOSPITAL_COMMUNITY)
Admission: EM | Admit: 2016-06-16 | Discharge: 2016-06-16 | Disposition: A | Discharge status: Home / Self Care | Payer: Medicare Other | Attending: Emergency Medicine | Admitting: Emergency Medicine

## 2016-06-16 DIAGNOSIS — Z7982 Long term (current) use of aspirin: Secondary | ICD-10-CM | POA: Insufficient documentation

## 2016-06-16 DIAGNOSIS — Z79899 Other long term (current) drug therapy: Secondary | ICD-10-CM | POA: Diagnosis not present

## 2016-06-16 DIAGNOSIS — J40 Bronchitis, not specified as acute or chronic: Secondary | ICD-10-CM | POA: Diagnosis not present

## 2016-06-16 DIAGNOSIS — R05 Cough: Secondary | ICD-10-CM | POA: Diagnosis present

## 2016-06-16 DIAGNOSIS — R42 Dizziness and giddiness: Secondary | ICD-10-CM | POA: Insufficient documentation

## 2016-06-16 LAB — I-STAT CHEM 8, ED
BUN: 9 mg/dL (ref 6–20)
CHLORIDE: 104 mmol/L (ref 101–111)
CREATININE: 0.7 mg/dL (ref 0.44–1.00)
Calcium, Ion: 1.21 mmol/L (ref 1.15–1.40)
GLUCOSE: 97 mg/dL (ref 65–99)
HEMATOCRIT: 42 % (ref 36.0–46.0)
HEMOGLOBIN: 14.3 g/dL (ref 12.0–15.0)
POTASSIUM: 3.8 mmol/L (ref 3.5–5.1)
Sodium: 141 mmol/L (ref 135–145)
TCO2: 27 mmol/L (ref 0–100)

## 2016-06-16 MED ORDER — MECLIZINE HCL 12.5 MG PO TABS: 12.5000 mg | ORAL_TABLET | Freq: Three times a day (TID) | ORAL | 0 refills | Status: AC | PRN

## 2016-06-16 MED ORDER — AZITHROMYCIN 250 MG PO TABS
250.0000 mg | ORAL_TABLET | Freq: Every day | ORAL | 0 refills | Status: DC
Start: 2016-06-16 — End: 2016-06-25

## 2016-06-16 MED ORDER — MECLIZINE HCL 25 MG PO TABS
12.5000 mg | ORAL_TABLET | Freq: Once | ORAL | Status: AC
  Administered 2016-06-16: 12.5 mg via ORAL
  Filled 2016-06-16: qty 1

## 2016-06-16 MED ORDER — GUAIFENESIN 100 MG/5ML PO LIQD: 100.0000 mg | Freq: Three times a day (TID) | ORAL | 0 refills | Status: DC | PRN

## 2016-06-16 NOTE — ED Triage Notes (Signed)
Patient states that couple days during the week she gets up from bed In the mornings with dizziness, sometimes it stays all day. Patient was told she has vertigo.  Patient also has non-productive cough, congestion x couple weeks.

## 2016-06-16 NOTE — ED Provider Notes (Signed)
[facility] Provider Note   CSN: [id number] Arrival date & time: 06/16/16  8938     History   Chief Complaint Chief Complaint  Patient presents with  . Dizziness  . Nasal Congestion  . Cough    HPI Edith W Anselmo is a 75 y.o. female.  The history is provided by the patient. No language interpreter was used.  Dizziness  Cough    Eldine Viona Gilmore Krogh is a 75 y.o. female who presents to the Emergency Department complaining of dizziness, nasal congestion.  She reports 3 weeks of nasal congestion with yellow mucus and cough intermittently productive of yellow sputum. She had vertigo 3 weeks ago described as intermittent dizziness with standing. Her symptoms resolved only to return to days ago. Episodes of dizziness are only present with change in positions in the last less than 2 minutes. She denies any fever, headache, chest pain, shortness of breath, abdominal pain. She has intermittent numbness in bilateral hands (for weeks). No weakness. 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 Over the last year she has had issues with ataxia. Past Medical History:  Diagnosis Date  . Arthritis   . Blindness    90%  . Breast cancer ([place]) 03/19/2007   right  . Choledocholithiasis with obstruction 11/16/2012  . GERD (gastroesophageal reflux disease)   . Glaucoma   . Herpes simplex type II infection 1999  . Hyperlipidemia   . Irritable bowel syndrome (IBS)   . Mitral valve prolapse   . Myasthenia gravis    eyelids weakness.     Patient Active Problem List   Diagnosis Date Noted  . Breast cancer of lower-inner quadrant of right female breast ([place]) 06/14/2013  . Vestibular labyrinthitis 04/03/2013  . Dizziness 04/01/2013  . Cholelithiasis 11/16/2012  . Choledocholithiasis with obstruction 11/16/2012  . Jaundice 11/16/2012  . Nausea and vomiting 11/14/2012  . Abdominal pain, right upper quadrant 11/14/2012    . Arthritis 12/20/2011  . Glaucoma 12/20/2011  . Hypercholesterolemia 12/20/2011    Past Surgical History:  Procedure Laterality Date  . AXILLARY NODE DISSECTION  05/06/2007   right  . BREAST LUMPECTOMY  04/06/2007   right  . CHOLECYSTECTOMY N/A 11/18/2012   Procedure: LAPAROSCOPIC CHOLECYSTECTOMY WITH INTRAOPERATIVE CHOLANGIOGRAM;  Surgeon: Madilyn Hook, DO;  Location: [facility];  Service: General;  Laterality: N/A;  . ERCP N/A 11/16/2012   Procedure: ENDOSCOPIC RETROGRADE CHOLANGIOPANCREATOGRAPHY (ERCP);  Surgeon: Gatha Mayer, MD;  Location: [facility];  Service: Endoscopy;  Laterality: N/A;  . ERCP  11/16/2012  . EYE SURGERY  1993  . KNEE SURGERY  2011  . TONSILLECTOMY  75 YEARS OLD  . TOTAL ABDOMINAL HYSTERECTOMY W/ BILATERAL SALPINGOOPHORECTOMY    . VARICOSE VEIN SURGERY  70S    OB History    No data available       Home Medications    Prior to Admission medications   Medication Sig Start Date End Date Taking? Authorizing Provider  anastrozole (ARIMIDEX) 1 MG tablet Take 1 tablet (1 mg total) by mouth daily. 06/20/15  Yes Chauncey Cruel, MD  aspirin 81 MG chewable tablet Chew 81 mg by mouth daily.   Yes Historical Provider, MD  cholecalciferol (VITAMIN D) 1000 UNITS tablet Take 1 tablet (1,000 Units total) by mouth daily. 06/25/15  Yes Chauncey Cruel, MD  diphenhydrAMINE (BENADRYL) 25 MG tablet Take 50 mg by mouth every 6 (six) hours as needed  for itching, allergies or sleep.   Yes Historical Provider, MD  Loteprednol Etabonate (LOTEMAX) 0.5 % GEL Place 1 drop into the right eye 2 (two) times daily.   Yes Historical Provider, MD  Multiple Vitamin (MULTIVITAMIN WITH MINERALS) TABS Take 1 tablet by mouth daily.    Yes Historical Provider, MD  omega-3 acid ethyl esters (LOVAZA) 1 g capsule Take 1 g by mouth every other day.   Yes Historical Provider, MD  timolol (TIMOPTIC-XR) 0.5 % ophthalmic gel-forming Place 1 drop into the right eye at bedtime.   Yes Historical Provider, MD   vitamin C (ASCORBIC ACID) 500 MG tablet Take 500 mg by mouth daily.   Yes Historical Provider, MD  azithromycin (ZITHROMAX) 250 MG tablet Take 1 tablet (250 mg total) by mouth daily. Take first 2 tablets together, then 1 every day until finished. 06/16/16   Quintella Reichert, MD  guaiFENesin (ROBITUSSIN) 100 MG/5ML liquid Take 5-10 mLs (100-200 mg total) by mouth 3 (three) times daily as needed for cough. 06/16/16   Quintella Reichert, MD  meclizine (ANTIVERT) 12.5 MG tablet Take 1 tablet (12.5 mg total) by mouth 3 (three) times daily as needed for dizziness. 06/16/16   Quintella Reichert, MD    Family History Family History  Problem Relation Age of Onset  . Cancer Mother   . Hypertension Sister     Social History Social History  Substance Use Topics  . Smoking status: Never Smoker  . Smokeless tobacco: Never Used  . Alcohol use No     Allergies   Pravachol   Review of Systems Review of Systems  Respiratory: Positive for cough.   Neurological: Positive for dizziness.  All other systems reviewed and are negative.    Physical Exam Updated Vital Signs BP 111/65   Pulse (!) 58   Temp 97.7 F (36.5 C) (Oral)   Resp 16   SpO2 100%   Physical Exam  Constitutional: She is oriented to person, place, and time. She appears well-developed and well-nourished.  HENT:  Head: Normocephalic and atraumatic.  Eyes:  Left pupil is small and minimally reactive. Right pupil is irregular, dilated and minimally reactive.  Cardiovascular: Normal rate and regular rhythm.   No murmur heard. Pulmonary/Chest: Effort normal and breath sounds normal. No respiratory distress.  Abdominal: Soft. There is no tenderness. There is no rebound and no guarding.  Musculoskeletal: She exhibits no edema or tenderness.  Neurological: She is alert and oriented to person, place, and time.  5 out of 5 strength in all 4 extremities, sensation to light touch intact in all 4 extremities. No weakness of facial muscles.  There is ataxia on finger to nose with past pointing bilaterally.  Skin: Skin is warm and dry.  Psychiatric: She has a normal mood and affect. Her behavior is normal.  Nursing note and vitals reviewed.    ED Treatments / Results  Labs (all labs ordered are listed, but only abnormal results are displayed) Labs Reviewed  I-STAT CHEM 8, ED    EKG  EKG Interpretation  Date/Time:  Sunday June 16 2016 10:01:55 EDT Ventricular Rate:  63 PR Interval:    QRS Duration: 89 QT Interval:  433 QTC Calculation: 444 R Axis:   -44 Text Interpretation:  Sinus rhythm Left anterior fascicular block Probable anterior infarct, age indeterminate No significant change since last tracing Confirmed by Hazle Coca [phone]) on 06/16/2016 10:24:15 AM Also confirmed by Hazle Coca [phone]), editor [place]  CCT, [place] ([id number])  on 06/16/2016 11:14:57  AM       Radiology Dg Chest 2 View  Result Date: 06/16/2016 CLINICAL DATA:  Pt c/o cough, intermittent fever, weakness, sob x 3 wks, new onset dizziness x 3 days, pt hx RIGHT sided breast cancer with breast surgery distantly. Hx MVP, lumpectomy 2008 per epic chart. EXAM: CHEST  2 VIEW COMPARISON:  Chest x-rays dated 02/16/2006 and 07/29/2015. FINDINGS: Heart size is upper normal, stable. Mediastinal contours are stable. Lungs are clear. No pleural effusion or pneumothorax seen. Osseous structures about the chest are unremarkable. Surgical clips again noted in the right axilla. IMPRESSION: No active cardiopulmonary disease. No evidence of pneumonia or pulmonary edema. Electronically Signed   By: Franki Cabot M.D.   On: 06/16/2016 09:48   Ct Head Wo Contrast  Result Date: 06/16/2016 CLINICAL DATA:  History of breast cancer.  Dizziness. EXAM: CT HEAD WITHOUT CONTRAST TECHNIQUE: Contiguous axial images were obtained from the base of the skull through the vertex without intravenous contrast. COMPARISON:  MRI of the brain April 01, 2013 FINDINGS: Brain: No evidence of  acute infarction, hemorrhage, hydrocephalus, extra-axial collection or mass lesion/mass effect. Vascular: No hyperdense vessel or unexpected calcification. Skull: Normal. Negative for fracture or focal lesion. Sinuses/Orbits: No acute finding. Other: None. IMPRESSION: No acute abnormalities. Electronically Signed   By: Dorise Bullion III M.D   On: 06/16/2016 10:00    Procedures Procedures (including critical care time)  Medications Ordered in ED Medications  meclizine (ANTIVERT) tablet 12.5 mg (12.5 mg Oral Given 06/16/16 1016)     Initial Impression / Assessment and Plan / ED Course  I have reviewed the triage vital signs and the nursing notes.  Pertinent labs & imaging results that were available during my care of the patient were reviewed by me and considered in my medical decision making (see chart for details).  Clinical Course    Patient here for evaluation of congestion, intermittent dizziness. Based on history and presentation is consistent with benign vertigo. Patient does have baseline abnormalities in her neurologic examination that make it difficult to fully rule out central vertigo. CT scan with no acute abnormalities. Based on normal CT scan and history convincing for benign vertigo will treat as such. Presentation is not consistent with pneumonia, sepsis, CHF, CVA. Discussed home care, outpatient follow-up, return precautions.  Final Clinical Impressions(s) / ED Diagnoses   Final diagnoses:  Vertigo  Bronchitis    New Prescriptions Discharge Medication List as of 06/16/2016 10:32 AM    START taking these medications   Details  azithromycin (ZITHROMAX) 250 MG tablet Take 1 tablet (250 mg total) by mouth daily. Take first 2 tablets together, then 1 every day until finished., Starting Sun 06/16/2016, Print    guaiFENesin (ROBITUSSIN) 100 MG/5ML liquid Take 5-10 mLs (100-200 mg total) by mouth 3 (three) times daily as needed for cough., Starting Sun 06/16/2016, Print      meclizine (ANTIVERT) 12.5 MG tablet Take 1 tablet (12.5 mg total) by mouth 3 (three) times daily as needed for dizziness., Starting Sun 06/16/2016, Print         Quintella Reichert, MD 06/17/16 [phone]

## 2016-06-16 NOTE — ED Notes (Signed)
Patient transported to X-ray 

## 2016-06-19 ENCOUNTER — Other Ambulatory Visit: Payer: Medicare Other

## 2016-06-19 ENCOUNTER — Ambulatory Visit: Payer: Medicare Other | Admitting: Nurse Practitioner

## 2016-06-24 ENCOUNTER — Other Ambulatory Visit: Payer: Self-pay | Admitting: *Deleted

## 2016-06-24 ENCOUNTER — Other Ambulatory Visit: Payer: Self-pay | Admitting: Oncology

## 2016-06-24 DIAGNOSIS — C50311 Malignant neoplasm of lower-inner quadrant of right female breast: Secondary | ICD-10-CM

## 2016-06-25 ENCOUNTER — Other Ambulatory Visit (HOSPITAL_BASED_OUTPATIENT_CLINIC_OR_DEPARTMENT_OTHER): Payer: Medicare Other

## 2016-06-25 ENCOUNTER — Ambulatory Visit (HOSPITAL_BASED_OUTPATIENT_CLINIC_OR_DEPARTMENT_OTHER): Payer: Medicare Other | Admitting: Oncology

## 2016-06-25 ENCOUNTER — Encounter: Payer: Self-pay | Admitting: Oncology

## 2016-06-25 VITALS — BP 117/70 | HR 78 | Temp 98.0°F | Resp 18 | Ht 65.0 in | Wt 212.4 lb

## 2016-06-25 DIAGNOSIS — Z79811 Long term (current) use of aromatase inhibitors: Secondary | ICD-10-CM

## 2016-06-25 DIAGNOSIS — C50311 Malignant neoplasm of lower-inner quadrant of right female breast: Secondary | ICD-10-CM

## 2016-06-25 LAB — COMPREHENSIVE METABOLIC PANEL
ALT: 15 U/L (ref 0–55)
ANION GAP: 9 meq/L (ref 3–11)
AST: 18 U/L (ref 5–34)
Albumin: 3.3 g/dL — ABNORMAL LOW (ref 3.5–5.0)
Alkaline Phosphatase: 84 U/L (ref 40–150)
BILIRUBIN TOTAL: 0.32 mg/dL (ref 0.20–1.20)
BUN: 12.2 mg/dL (ref 7.0–26.0)
CO2: 27 meq/L (ref 22–29)
Calcium: 9.7 mg/dL (ref 8.4–10.4)
Chloride: 105 mEq/L (ref 98–109)
Creatinine: 0.8 mg/dL (ref 0.6–1.1)
EGFR: 84 mL/min/{1.73_m2} — AB (ref >90)
GLUCOSE: 105 mg/dL (ref 70–140)
POTASSIUM: 4.1 meq/L (ref 3.5–5.1)
SODIUM: 140 meq/L (ref 136–145)
Total Protein: 6.9 g/dL (ref 6.4–8.3)

## 2016-06-25 LAB — CBC WITH DIFFERENTIAL/PLATELET
BASO%: 0.6 % (ref 0.0–2.0)
BASOS ABS: 0 10*3/uL (ref 0.0–0.1)
EOS ABS: 0.3 10*3/uL (ref 0.0–0.5)
EOS%: 6.1 % (ref 0.0–7.0)
HCT: 41.9 % (ref 34.8–46.6)
HGB: 13.7 g/dL (ref 11.6–15.9)
LYMPH%: 49.9 % — AB (ref 14.0–49.7)
MCH: 29.8 pg (ref 25.1–34.0)
MCHC: 32.8 g/dL (ref 31.5–36.0)
MCV: 91 fL (ref 79.5–101.0)
MONO#: 0.4 10*3/uL (ref 0.1–0.9)
MONO%: 10.2 % (ref 0.0–14.0)
NEUT#: 1.5 10*3/uL (ref 1.5–6.5)
NEUT%: 33.2 % — AB (ref 38.4–76.8)
PLATELETS: 227 10*3/uL (ref 145–400)
RBC: 4.6 10*6/uL (ref 3.70–5.45)
RDW: 14.4 % (ref 11.2–14.5)
WBC: 4.4 10*3/uL (ref 3.9–10.3)
lymph#: 2.2 10*3/uL (ref 0.9–3.3)

## 2016-06-25 MED ORDER — ANASTROZOLE 1 MG PO TABS: 1.0000 mg | ORAL_TABLET | Freq: Every day | ORAL | 4 refills | Status: DC

## 2016-06-25 NOTE — Progress Notes (Signed)
ID: Angela [name] OB: 05/28/1941  MR#: [id number]  OQH#:[id number]  PCP: [name], MD GYN:   [facility] OTHER MD: Tyler Pita  CHIEF COMPLAINT: estrogen receptor positive breast cancer  CURRENT TREATMENT: anastrozole   BREAST CANCER HISTORY: From doctor Lauretta Odogwu's intake note 04/23/2007:  "The patient was recently performing a breast exam where she noted a nodule in the right lower quadrant of the right breast.  Dr. Jana Hakim referred her for mammogram and ultrasound.  The mammogram and ultrasound performed on 03/15/2007 demonstrated a subcentimeter density in the lower inner quadrant of the right breast.  The density was a subcutaneous hyperechoic mass with irregular borders measuring 1 x 0.6 x 0.7 cm.  An ultrasound guided fine needle aspiration performed was consistent with an adenocarcinoma.  On 03/31/2007 the patient was referred for bilateral MRI of breasts.  The findings demonstrated a 1.3 x 1.2 x 1 cm with irregular margins in the lower inner aspect of the right breast corresponding to the subcutaneous mass in the 5 o'clock position that was previously biopsied.  There were no other masses seen and no abnormal appearing lymph nodes demonstrated.  The visualized chest wall was unremarkable.  On April 05, 2004 Dr. Neldon Mc performed a lumpectomy with sentinel node dissection.  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  The tumor was 1 cm from the resection margin.  One of two sentinel lymph node samples was positive for metastatic adenocarcinoma.  There was evidence of lymphovascular invasion.  The tumor was positive for estrogen receptor expression and negative for progesterone receptor expression.  The invasive tumor demonstrated an increased proliferation rate.  The tumor was positive for HER2/neu overexpression as mentioned by the HercepTest method. "  The patient's subsequent history is as detailed below   INTERVAL  HISTORY: Angela [name] returns today for follow up of her breast cancer. She continues on anastrozole, which she tolerates Well. She denies hot flashes, night sweats, vaginal dryness issues, or arthralgias myalgias. She was recently seen in the emergency room for vertigo and bronchitis. She was given a Z-Pak and Antivert and her symptoms have improved.   REVIEW OF SYSTEMS: She has been doing well. Her main concern of course is care of her husband who has severe MS and she has to help with many of his activities of daily living. 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 Aside from these issues a review of systems today was stable.  PAST MEDICAL HISTORY: Past Medical History:  Diagnosis Date  . Arthritis   . Blindness    90%  . Breast cancer ([place]) 03/19/2007   right  . Choledocholithiasis with obstruction 11/16/2012  . GERD (gastroesophageal reflux disease)   . Glaucoma   . Herpes simplex type II infection 1999  . Hyperlipidemia   . Irritable bowel syndrome (IBS)   . Mitral valve prolapse   . Myasthenia gravis    eyelids weakness.     PAST SURGICAL HISTORY: Past Surgical History:  Procedure Laterality Date  . AXILLARY NODE DISSECTION  05/06/2007   right  . BREAST LUMPECTOMY  04/06/2007   right  . CHOLECYSTECTOMY N/A 11/18/2012   Procedure: LAPAROSCOPIC CHOLECYSTECTOMY WITH INTRAOPERATIVE CHOLANGIOGRAM;  Surgeon: Madilyn Hook, DO;  Location: [facility];  Service: General;  Laterality: N/A;  . ERCP N/A 11/16/2012   Procedure: ENDOSCOPIC RETROGRADE CHOLANGIOPANCREATOGRAPHY (ERCP);  Surgeon: Gatha Mayer, MD;  Location: [facility];  Service: Endoscopy;  Laterality:  N/A;  . ERCP  11/16/2012  . EYE SURGERY  1993  . KNEE SURGERY  2011  . TONSILLECTOMY  75 YEARS OLD  . TOTAL ABDOMINAL HYSTERECTOMY W/ BILATERAL SALPINGOOPHORECTOMY    . VARICOSE VEIN SURGERY  70S    FAMILY HISTORY Family History  Problem Relation Age of Onset  . Cancer Mother   .  Hypertension Sister    no family history of breast or ovarian cancer. There is a history of prostate cancel in second-degree relatives  GYNECOLOGIC HISTORY:  Menarche age 23, first child age 30, she is [place] P2. She underwent hysterectomy with bilateral salpingo-oophorectomy in her 59s. She did not take hormone replacement  SOCIAL HISTORY:  The patient used to work in Scientist, clinical (histocompatibility and immunogenetics), but is now retired. Her husband Kerry Dory, used to work for [employer]. He has MS. The patient has 2 sons.    ADVANCED DIRECTIVES: not in place   HEALTH MAINTENANCE: Social History  Substance Use Topics  . Smoking status: Never Smoker  . Smokeless tobacco: Never Used  . Alcohol use No     Colonoscopy:  PAP:  Bone density:  Lipid panel:  Allergies  Allergen Reactions  . Pravachol Rash    Current Outpatient Prescriptions  Medication Sig Dispense Refill  . anastrozole (ARIMIDEX) 1 MG tablet Take 1 tablet (1 mg total) by mouth daily. 90 tablet 4  . aspirin 81 MG chewable tablet Chew 81 mg by mouth daily.    . cholecalciferol (VITAMIN D) 1000 UNITS tablet Take 1 tablet (1,000 Units total) by mouth daily. 90 tablet 4  . diphenhydrAMINE (BENADRYL) 25 MG tablet Take 50 mg by mouth every 6 (six) hours as needed for itching, allergies or sleep.    . Loteprednol Etabonate (LOTEMAX) 0.5 % GEL Place 1 drop into the right eye 2 (two) times daily.    . meclizine (ANTIVERT) 12.5 MG tablet Take 1 tablet (12.5 mg total) by mouth 3 (three) times daily as needed for dizziness. 10 tablet 0  . Multiple Vitamin (MULTIVITAMIN WITH MINERALS) TABS Take 1 tablet by mouth daily.     Marland Kitchen omega-3 acid ethyl esters (LOVAZA) 1 g capsule Take 1 g by mouth every other day.    . timolol (TIMOPTIC-XR) 0.5 % ophthalmic gel-forming Place 1 drop into the right eye at bedtime.    . vitamin C (ASCORBIC ACID) 500 MG tablet Take 500 mg by mouth daily.    Marland Kitchen guaiFENesin (ROBITUSSIN) 100 MG/5ML liquid Take 5-10 mLs (100-200 mg total) by mouth 3  (three) times daily as needed for cough. (Patient not taking: Reported on 06/25/2016) 118 mL 0   No current facility-administered medications for this visit.     OBJECTIVE: Older African American woman in no acute distress Vitals:   06/25/16 1141  BP: 117/70  Pulse: 78  Resp: 18  Temp: 98 F (36.7 C)     Body mass index is 35.35 kg/m.    ECOG FS:1 - Symptomatic but completely ambulatory  Sclerae unicteric, the patient is legally blind Oropharynx clear and moist-- no thrush or other lesions No cervical or supraclavicular adenopathy Lungs no rales or rhonchi Heart regular rate and rhythm Abd soft, nontender, positive bowel sounds MSK no focal spinal tenderness, no upper extremity lymphedema Neuro: nonfocal, well oriented, appropriate affect Breasts: the right breast is status post lumpectomy and radiation. There is no evidence of local recurrence. The right axilla is benign. The left breast is unremarkable.   LAB RESULTS:  CMP  Component Value Date/Time   NA 140 06/25/2016 1128   K 4.1 06/25/2016 1128   CL 104 06/16/2016 0958   CL 107 12/11/2012 1104   CO2 27 06/25/2016 1128   GLUCOSE 105 06/25/2016 1128   GLUCOSE 117 (H) 12/11/2012 1104   BUN 12.2 06/25/2016 1128   CREATININE 0.8 06/25/2016 1128   CALCIUM 9.7 06/25/2016 1128   PROT 6.9 06/25/2016 1128   ALBUMIN 3.3 (L) 06/25/2016 1128   AST 18 06/25/2016 1128   ALT 15 06/25/2016 1128   ALKPHOS 84 06/25/2016 1128   BILITOT 0.32 06/25/2016 1128   GFRNONAA 84 (L) 04/02/2013 0650   GFRAA >90 04/02/2013 0650    I No results found for: SPEP  Lab Results  Component Value Date   WBC 4.4 06/25/2016   NEUTROABS 1.5 06/25/2016   HGB 13.7 06/25/2016   HCT 41.9 06/25/2016   MCV 91.0 06/25/2016   PLT 227 06/25/2016      Chemistry      Component Value Date/Time   NA 140 06/25/2016 1128   K 4.1 06/25/2016 1128   CL 104 06/16/2016 0958   CL 107 12/11/2012 1104   CO2 27 06/25/2016 1128   BUN 12.2 06/25/2016  1128   CREATININE 0.8 06/25/2016 1128      Component Value Date/Time   CALCIUM 9.7 06/25/2016 1128   ALKPHOS 84 06/25/2016 1128   AST 18 06/25/2016 1128   ALT 15 06/25/2016 1128   BILITOT 0.32 06/25/2016 1128       Lab Results  Component Value Date   LABCA2 23 12/11/2012    No components found for: [id number]  No results for input(s): INR in the last 168 hours.  Urinalysis    Component Value Date/Time   COLORURINE YELLOW 04/01/2013 1812   APPEARANCEUR TURBID (A) 04/01/2013 1812   LABSPEC 1.023 04/01/2013 1812   PHURINE 6.5 04/01/2013 1812   GLUCOSEU NEGATIVE 04/01/2013 1812   HGBUR SMALL (A) 04/01/2013 1812   BILIRUBINUR NEGATIVE 04/01/2013 1812   KETONESUR NEGATIVE 04/01/2013 1812   PROTEINUR NEGATIVE 04/01/2013 1812   UROBILINOGEN 0.2 04/01/2013 1812   NITRITE NEGATIVE 04/01/2013 1812   LEUKOCYTESUR LARGE (A) 04/01/2013 1812    STUDIES: We have obtained the results from [facility] of her mammography on the right 04/12/2016 and her bone density 06/16/2014 which showed a T score of -2.3.  ASSESSMENT: 75 y.o. [place] woman status post right lumpectomy and sentinel lymph node sampling 04/06/2007 for a pT1c, pN1, stage IIA invasive ductal carcinoma, grade 2, estrogen receptor 55% positive, progesterone receptor negative, with an MIB-1 of 35%, and HercepTest amplified at 3+.  1. On 05/06/2007 she underwent right axillary lymph node dissection with an additional 14 lymph nodes benign, so the patient had a total of 2/16 lymph nodes positive.  2. She enrolled in the [facility] trial and received docetaxel, carboplatin, bevacizumab and trastuzumab through January of 2009. [The trastuzumab and bevacizumab were continued through September of 2009].  3 completed radiation to the right breast April 2009  3. She started anastrozole April of 2009, and we will continue thatto a total of 10 years  (a) osteopenia, with a T score of -2.3 on bone density scan October 2017   4. patient is  legally blind  PLAN: Angela [name] Is now 9 years out from her definitive surgery with no evidence of disease recurrence. This is very favorable.  She tolerates the anastrozole well, with no significant hot flashes or night sweats and obtains it at a good price.  The plan is to continue that for 1 additional year. At that point she will graduate from follow-up  We discussed osteopenia, and I have asked her to continue on vitamin D 1000 units daily. She likely is obtaining enough calcium through her regular diet.   She will have a repeat mammogram in August 2018 with a return visit to our office in approximately one year.  She knows to call for any problems that may develop before her next visit here.  Mikey Bussing, NP   06/25/2016 4:09 PM

## 2016-06-26 ENCOUNTER — Other Ambulatory Visit: Payer: Self-pay | Admitting: Oncology

## 2016-07-10 ENCOUNTER — Telehealth: Payer: Self-pay | Admitting: General Practice

## 2016-07-10 NOTE — Telephone Encounter (Signed)
Spoke with patient confirmed October 2018 appointments.

## 2016-09-12 DIAGNOSIS — Z79899 Other long term (current) drug therapy: Secondary | ICD-10-CM | POA: Diagnosis not present

## 2016-10-14 DIAGNOSIS — Z853 Personal history of malignant neoplasm of breast: Secondary | ICD-10-CM | POA: Diagnosis not present

## 2016-10-14 DIAGNOSIS — Q150 Congenital glaucoma: Secondary | ICD-10-CM | POA: Diagnosis not present

## 2016-10-14 DIAGNOSIS — Z1501 Genetic susceptibility to malignant neoplasm of breast: Secondary | ICD-10-CM | POA: Diagnosis not present

## 2016-10-14 DIAGNOSIS — Z8042 Family history of malignant neoplasm of prostate: Secondary | ICD-10-CM | POA: Diagnosis not present

## 2016-11-19 DIAGNOSIS — Z853 Personal history of malignant neoplasm of breast: Secondary | ICD-10-CM | POA: Diagnosis not present

## 2016-11-19 DIAGNOSIS — H409 Unspecified glaucoma: Secondary | ICD-10-CM | POA: Diagnosis not present

## 2016-11-19 DIAGNOSIS — Z741 Need for assistance with personal care: Secondary | ICD-10-CM | POA: Diagnosis not present

## 2017-02-03 DIAGNOSIS — H2512 Age-related nuclear cataract, left eye: Secondary | ICD-10-CM | POA: Diagnosis not present

## 2017-02-03 DIAGNOSIS — H35373 Puckering of macula, bilateral: Secondary | ICD-10-CM | POA: Diagnosis not present

## 2017-02-03 DIAGNOSIS — Q150 Congenital glaucoma: Secondary | ICD-10-CM | POA: Diagnosis not present

## 2017-02-04 DIAGNOSIS — M25561 Pain in right knee: Secondary | ICD-10-CM | POA: Diagnosis not present

## 2017-02-12 DIAGNOSIS — Z79899 Other long term (current) drug therapy: Secondary | ICD-10-CM | POA: Diagnosis not present

## 2017-04-14 DIAGNOSIS — Z853 Personal history of malignant neoplasm of breast: Secondary | ICD-10-CM | POA: Diagnosis not present

## 2017-04-14 DIAGNOSIS — Z1231 Encounter for screening mammogram for malignant neoplasm of breast: Secondary | ICD-10-CM | POA: Diagnosis not present
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
## 2017-05-28 DIAGNOSIS — Z23 Encounter for immunization: Secondary | ICD-10-CM | POA: Diagnosis not present

## 2017-06-03 DIAGNOSIS — Q150 Congenital glaucoma: Secondary | ICD-10-CM | POA: Diagnosis not present

## 2017-06-27 ENCOUNTER — Other Ambulatory Visit: Payer: Self-pay | Admitting: *Deleted

## 2017-06-27 DIAGNOSIS — C50311 Malignant neoplasm of lower-inner quadrant of right female breast: Secondary | ICD-10-CM

## 2017-06-29 NOTE — Progress Notes (Signed)
No show

## 2017-06-30 ENCOUNTER — Encounter: Payer: Self-pay | Admitting: Oncology

## 2017-06-30 ENCOUNTER — Other Ambulatory Visit: Payer: Medicare Other

## 2017-06-30 ENCOUNTER — Ambulatory Visit (HOSPITAL_BASED_OUTPATIENT_CLINIC_OR_DEPARTMENT_OTHER): Payer: Medicare Other | Admitting: Oncology

## 2017-06-30 DIAGNOSIS — C50311 Malignant neoplasm of lower-inner quadrant of right female breast: Secondary | ICD-10-CM

## 2017-07-01 DIAGNOSIS — Z79899 Other long term (current) drug therapy: Secondary | ICD-10-CM | POA: Diagnosis not present

## 2017-08-04 DIAGNOSIS — Z8042 Family history of malignant neoplasm of prostate: Secondary | ICD-10-CM | POA: Diagnosis not present

## 2017-08-04 DIAGNOSIS — Z800 Family history of malignant neoplasm of digestive organs: Secondary | ICD-10-CM | POA: Diagnosis not present

## 2017-08-04 DIAGNOSIS — Z853 Personal history of malignant neoplasm of breast: Secondary | ICD-10-CM | POA: Diagnosis not present

## 2017-08-04 NOTE — Progress Notes (Signed)
ID: Angela [name] OB: 07/21/1941  MR#: [id number]  XKG#:[id number]  PCP: [name], MD GYN:   [facility] OTHER MD: Tyler Pita  CHIEF COMPLAINT: estrogen receptor positive breast cancer  CURRENT TREATMENT: anastrozole   BREAST CANCER HISTORY: From doctor Lauretta Odogwu's intake note 04/23/2007:  "The patient was recently performing a breast exam where she noted a nodule in the right lower quadrant of the right breast.  Dr. Jana Hakim referred her for mammogram and ultrasound.  The mammogram and ultrasound performed on 03/15/2007 demonstrated a subcentimeter density in the lower inner quadrant of the right breast.  The density was a subcutaneous hyperechoic mass with irregular borders measuring 1 x 0.6 x 0.7 cm.  An ultrasound guided fine needle aspiration performed was consistent with an adenocarcinoma.  On 03/31/2007 the patient was referred for bilateral MRI of breasts.  The findings demonstrated a 1.3 x 1.2 x 1 cm with irregular margins in the lower inner aspect of the right breast corresponding to the subcutaneous mass in the 5 o'clock position that was previously biopsied.  There were no other masses seen and no abnormal appearing lymph nodes demonstrated.  The visualized chest wall was unremarkable.  On April 05, 2004 Dr. Neldon Mc performed a lumpectomy with sentinel node dissection.  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  The tumor was 1 cm from the resection margin.  One of two sentinel lymph node samples was positive for metastatic adenocarcinoma.  There was evidence of lymphovascular invasion.  The tumor was positive for estrogen receptor expression and negative for progesterone receptor expression.  The invasive tumor demonstrated an increased proliferation rate.  The tumor was positive for HER2/neu overexpression as mentioned by the HercepTest method. "  The patient's subsequent history is as detailed below   INTERVAL  HISTORY: Shyna returns today for follow-up and treatment of her estrogen receptor positive breast cancer.  She continues on anastrozole, with good tolerance. She reports that she has skin rashes, although these are not likely related to the drug. She denies hot flashes and vaginal dryness.  Since her last visit to the office, she completed a bone density scan on 06/19/2016 at [facility] with a T-score of -2.3.  REVIEW OF SYSTEMS: Myla reports that her husband recently passed away. She reports that he kept getting sleepy and drooping. She reports that he had a slight stroke and other medical issues. She reports that her son is helping her for now. She reports that before her husband passed, she spent a lot of time taking care of him, but now she would like to visit her family that she hasn't seen in a while. She reports that her sister who lives in [place] came to visit her. 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 She denies unusual headaches, visual changes, nausea, vomiting, or dizziness. There has been no unusual cough, phlegm production, or pleurisy. This been no change in bowel or bladder habits. She denies unexplained fatigue or unexplained weight loss, bleeding, rash, or fever. A detailed review of systems was otherwise stable.    PAST MEDICAL HISTORY: Past Medical History:  Diagnosis Date  . Arthritis   . Blindness    90%  . Breast cancer ([place]) 03/19/2007   right  . Choledocholithiasis with obstruction 11/16/2012  . GERD (gastroesophageal reflux disease)   . Glaucoma   . Herpes simplex type II infection 1999  . Hyperlipidemia   . Irritable bowel  syndrome (IBS)   . Mitral valve prolapse   . Myasthenia gravis    eyelids weakness.     PAST SURGICAL HISTORY: Past Surgical History:  Procedure Laterality Date  . AXILLARY NODE DISSECTION  05/06/2007   right  . BREAST LUMPECTOMY  04/06/2007   right  . CHOLECYSTECTOMY N/A 11/18/2012   Procedure:  LAPAROSCOPIC CHOLECYSTECTOMY WITH INTRAOPERATIVE CHOLANGIOGRAM;  Surgeon: Madilyn Hook, DO;  Location: [facility];  Service: General;  Laterality: N/A;  . ERCP N/A 11/16/2012   Procedure: ENDOSCOPIC RETROGRADE CHOLANGIOPANCREATOGRAPHY (ERCP);  Surgeon: Gatha Mayer, MD;  Location: [facility];  Service: Endoscopy;  Laterality: N/A;  . ERCP  11/16/2012  . EYE SURGERY  1993  . KNEE SURGERY  October 23, 2009  . TONSILLECTOMY  76 YEARS OLD  . TOTAL ABDOMINAL HYSTERECTOMY W/ BILATERAL SALPINGOOPHORECTOMY    . VARICOSE VEIN SURGERY  76S    FAMILY HISTORY Family History  Problem Relation Age of Onset  . Cancer Mother   . Hypertension Sister    no family history of breast or ovarian cancer. There is a history of prostate cancel in second-degree relatives  GYNECOLOGIC HISTORY:  Menarche age 76, first child age 76, she is Flora P2. She underwent hysterectomy with bilateral salpingo-oophorectomy in her 74s. She did not take hormone replacement  SOCIAL HISTORY:  The patient used to work in Scientist, clinical (histocompatibility and immunogenetics), but is now retired. Her husband Kerry Dory, used to work for [employer]. He had MS. he died in the fall 10-23-16.  The patient has 2 sons.    ADVANCED DIRECTIVES: not in place   HEALTH MAINTENANCE: Social History   Tobacco Use  . Smoking status: Never Smoker  . Smokeless tobacco: Never Used  Substance Use Topics  . Alcohol use: No  . Drug use: No     Colonoscopy:  PAP:  Bone density:  Lipid panel:  Allergies  Allergen Reactions  . Pravachol Rash    Current Outpatient Medications  Medication Sig Dispense Refill  . anastrozole (ARIMIDEX) 1 MG tablet Take 1 tablet (1 mg total) by mouth daily. 90 tablet 4  . anastrozole (ARIMIDEX) 1 MG tablet TAKE 1 TABLET (1 MG TOTAL) BY MOUTH DAILY. 90 tablet 2  . aspirin 81 MG chewable tablet Chew 81 mg by mouth daily.    . cholecalciferol (VITAMIN D) 1000 UNITS tablet Take 1 tablet (1,000 Units total) by mouth daily. 90 tablet 4  . diphenhydrAMINE (BENADRYL) 25 MG tablet  Take 50 mg by mouth every 6 (six) hours as needed for itching, allergies or sleep.    Marland Kitchen guaiFENesin (ROBITUSSIN) 100 MG/5ML liquid Take 5-10 mLs (100-200 mg total) by mouth 3 (three) times daily as needed for cough. (Patient not taking: Reported on 06/25/2016) 118 mL 0  . Loteprednol Etabonate (LOTEMAX) 0.5 % GEL Place 1 drop into the right eye 2 (two) times daily.    . meclizine (ANTIVERT) 12.5 MG tablet Take 1 tablet (12.5 mg total) by mouth 3 (three) times daily as needed for dizziness. 10 tablet 0  . Multiple Vitamin (MULTIVITAMIN WITH MINERALS) TABS Take 1 tablet by mouth daily.     Marland Kitchen omega-3 acid ethyl esters (LOVAZA) 1 g capsule Take 1 g by mouth every other day.    . timolol (TIMOPTIC-XR) 0.5 % ophthalmic gel-forming Place 1 drop into the right eye at bedtime.    . vitamin C (ASCORBIC ACID) 500 MG tablet Take 500 mg by mouth daily.     No current facility-administered medications for this visit.  OBJECTIVE: Older African American woman  Vitals:   08/05/17 0910  BP: 117/81  Pulse: 71  Resp: 18  Temp: 98.3 F (36.8 C)  SpO2: 96%     Body mass index is 35.11 kg/m.    ECOG FS:1 - Symptomatic but completely ambulatory  Sclerae unicteric, she is legally blind Oropharynx clear and moist No cervical or supraclavicular adenopathy Lungs no rales or rhonchi Heart regular rate and rhythm Abd soft, nontender, positive bowel sounds MSK no focal spinal tenderness, no upper extremity lymphedema Neuro: nonfocal, well oriented, appropriate affect Breasts: The right breast has undergone lumpectomy followed by radiation, with no evidence of local or left breast is benign.  Both axilla are benign.  LAB RESULTS:  CMP     Component Value Date/Time   NA 141 08/05/2017 0857   K 4.2 08/05/2017 0857   CL 104 06/16/2016 0958   CL 107 12/11/2012 1104   CO2 26 08/05/2017 0857   GLUCOSE 101 08/05/2017 0857   GLUCOSE 117 (H) 12/11/2012 1104   BUN 14.1 08/05/2017 0857   CREATININE 0.8  08/05/2017 0857   CALCIUM 9.4 08/05/2017 0857   PROT 6.9 08/05/2017 0857   ALBUMIN 3.5 08/05/2017 0857   AST 16 08/05/2017 0857   ALT 14 08/05/2017 0857   ALKPHOS 84 08/05/2017 0857   BILITOT 0.39 08/05/2017 0857   GFRNONAA 84 (L) 04/02/2013 0650   GFRAA >90 04/02/2013 0650    I No results found for: SPEP  Lab Results  Component Value Date   WBC 3.3 (L) 08/05/2017   NEUTROABS 1.2 (L) 08/05/2017   HGB 13.9 08/05/2017   HCT 42.3 08/05/2017   MCV 91.7 08/05/2017   PLT 205 08/05/2017      Chemistry      Component Value Date/Time   NA 141 08/05/2017 0857   K 4.2 08/05/2017 0857   CL 104 06/16/2016 0958   CL 107 12/11/2012 1104   CO2 26 08/05/2017 0857   BUN 14.1 08/05/2017 0857   CREATININE 0.8 08/05/2017 0857      Component Value Date/Time   CALCIUM 9.4 08/05/2017 0857   ALKPHOS 84 08/05/2017 0857   AST 16 08/05/2017 0857   ALT 14 08/05/2017 0857   BILITOT 0.39 08/05/2017 0857       Lab Results  Component Value Date   LABCA2 23 12/11/2012    No components found for: [id number]  No results for input(s): INR in the last 168 hours.  Urinalysis    Component Value Date/Time   COLORURINE YELLOW 04/01/2013 1812   APPEARANCEUR TURBID (A) 04/01/2013 1812   LABSPEC 1.023 04/01/2013 1812   PHURINE 6.5 04/01/2013 1812   GLUCOSEU NEGATIVE 04/01/2013 1812   HGBUR SMALL (A) 04/01/2013 1812   BILIRUBINUR NEGATIVE 04/01/2013 1812   KETONESUR NEGATIVE 04/01/2013 1812   PROTEINUR NEGATIVE 04/01/2013 1812   UROBILINOGEN 0.2 04/01/2013 1812   NITRITE NEGATIVE 04/01/2013 1812   LEUKOCYTESUR LARGE (A) 04/01/2013 1812    STUDIES: Mammography at [facility] 04/14/2017 showed the breast density to be category B.  There was no evidence of malignancy.  Bone density 06/19/2016 showed a T score of -2.3  ASSESSMENT: 76 y.o. [place] woman status post right lumpectomy and sentinel lymph node sampling 04/06/2007 for a pT1c, pN1, stage IIA invasive ductal carcinoma, grade 2, estrogen  receptor 55% positive, progesterone receptor negative, with an MIB-1 of 35%, and HercepTest amplified at 3+.  1. On 05/06/2007 she underwent right axillary lymph node dissection with an additional 14 lymph nodes  benign, so the patient had a total of 2/16 lymph nodes positive.  2. She enrolled in the [facility] trial and received docetaxel, carboplatin, bevacizumab and trastuzumab through January of 2009. [The trastuzumab and bevacizumab were continued through September of 2009].  3 completed radiation to the right breast April 2009  3. She started anastrozole April of 2009, and we will continue thatto a total of 10 years  (a) osteopenia, with a T score of -1.9 on bone density scan October 2015   (b) bone density 06/19/2016 showed a T score of -2.3  4. patient is legally blind  PLAN: Drena is now 10 years out from definitive surgery for her breast cancer with no evidence of disease recurrence.  This is very favorable.  She will complete 10 years of anastrozole March of next year.  She has tolerated that well.  At this point I feel comfortable releasing her to her primary care physician.  All she will need in terms of breast cancer follow-up is a yearly physician breast exam and her yearly mammography which he obtains at [facility].  I will be glad to see Carlus Pavlov again at any point in the future if and when the need arises but as of now are making no further routine appointments for her here.  Magrinat, Virgie Dad, MD  08/05/17 9:45 AM Medical Oncology and Hematology [facility] [address] [place], [place] [address] Tel. [phone]    Fax. [phone]    This document serves as a record of services personally performed by Lurline Del, MD. It was created on his behalf by Sheron Nightingale, a trained medical scribe. The creation of this record is based on the scribe's personal observations and the provider's statements to them.   I have reviewed the above documentation for  accuracy and completeness, and I agree with the above.

## 2017-08-05 ENCOUNTER — Encounter: Payer: Self-pay | Admitting: Oncology

## 2017-08-05 ENCOUNTER — Other Ambulatory Visit (HOSPITAL_BASED_OUTPATIENT_CLINIC_OR_DEPARTMENT_OTHER): Payer: Medicare Other

## 2017-08-05 ENCOUNTER — Ambulatory Visit (HOSPITAL_BASED_OUTPATIENT_CLINIC_OR_DEPARTMENT_OTHER): Payer: Medicare Other | Admitting: Oncology

## 2017-08-05 VITALS — BP 117/81 | HR 71 | Temp 98.3°F | Resp 18 | Ht 65.0 in | Wt 211.0 lb

## 2017-08-05 DIAGNOSIS — C50311 Malignant neoplasm of lower-inner quadrant of right female breast: Secondary | ICD-10-CM | POA: Diagnosis not present

## 2017-08-05 DIAGNOSIS — Z170 Estrogen receptor positive status [ER+]: Secondary | ICD-10-CM | POA: Diagnosis not present

## 2017-08-05 DIAGNOSIS — Z79811 Long term (current) use of aromatase inhibitors: Secondary | ICD-10-CM | POA: Diagnosis not present

## 2017-08-05 LAB — COMPREHENSIVE METABOLIC PANEL
ALT: 14 U/L (ref 0–55)
ANION GAP: 8 meq/L (ref 3–11)
AST: 16 U/L (ref 5–34)
Albumin: 3.5 g/dL (ref 3.5–5.0)
Alkaline Phosphatase: 84 U/L (ref 40–150)
BILIRUBIN TOTAL: 0.39 mg/dL (ref 0.20–1.20)
BUN: 14.1 mg/dL (ref 7.0–26.0)
CHLORIDE: 107 meq/L (ref 98–109)
CO2: 26 meq/L (ref 22–29)
CREATININE: 0.8 mg/dL (ref 0.6–1.1)
Calcium: 9.4 mg/dL (ref 8.4–10.4)
EGFR: >60 mL/min/{1.73_m2} (ref >60)
GLUCOSE: 101 mg/dL (ref 70–140)
Potassium: 4.2 mEq/L (ref 3.5–5.1)
SODIUM: 141 meq/L (ref 136–145)
TOTAL PROTEIN: 6.9 g/dL (ref 6.4–8.3)

## 2017-08-05 LAB — CBC WITH DIFFERENTIAL/PLATELET
BASO%: 0.5 % (ref 0.0–2.0)
Basophils Absolute: 0 10*3/uL (ref 0.0–0.1)
EOS ABS: 0.1 10*3/uL (ref 0.0–0.5)
EOS%: 3.8 % (ref 0.0–7.0)
HEMATOCRIT: 42.3 % (ref 34.8–46.6)
HEMOGLOBIN: 13.9 g/dL (ref 11.6–15.9)
LYMPH#: 1.6 10*3/uL (ref 0.9–3.3)
LYMPH%: 47.6 % (ref 14.0–49.7)
MCH: 30.1 pg (ref 25.1–34.0)
MCHC: 32.8 g/dL (ref 31.5–36.0)
MCV: 91.7 fL (ref 79.5–101.0)
MONO#: 0.3 10*3/uL (ref 0.1–0.9)
MONO%: 10.4 % (ref 0.0–14.0)
NEUT%: 37.7 % — ABNORMAL LOW (ref 38.4–76.8)
NEUTROS ABS: 1.2 10*3/uL — AB (ref 1.5–6.5)
PLATELETS: 205 10*3/uL (ref 145–400)
RBC: 4.61 10*6/uL (ref 3.70–5.45)
RDW: 13.9 % (ref 11.2–14.5)
WBC: 3.3 10*3/uL — AB (ref 3.9–10.3)

## 2017-09-26 DIAGNOSIS — C3490 Malignant neoplasm of unspecified part of unspecified bronchus or lung: Secondary | ICD-10-CM | POA: Diagnosis not present

## 2017-09-26 DIAGNOSIS — Z8542 Personal history of malignant neoplasm of other parts of uterus: Secondary | ICD-10-CM | POA: Diagnosis not present

## 2017-09-26 DIAGNOSIS — Z853 Personal history of malignant neoplasm of breast: Secondary | ICD-10-CM | POA: Diagnosis not present

## 2017-09-26 DIAGNOSIS — Z8042 Family history of malignant neoplasm of prostate: Secondary | ICD-10-CM | POA: Diagnosis not present

## 2017-09-26 DIAGNOSIS — C50919 Malignant neoplasm of unspecified site of unspecified female breast: Secondary | ICD-10-CM | POA: Diagnosis not present

## 2017-09-26 DIAGNOSIS — C61 Malignant neoplasm of prostate: Secondary | ICD-10-CM | POA: Diagnosis not present

## 2017-09-26 DIAGNOSIS — Z801 Family history of malignant neoplasm of trachea, bronchus and lung: Secondary | ICD-10-CM | POA: Diagnosis not present

## 2017-09-29 DIAGNOSIS — Z79899 Other long term (current) drug therapy: Secondary | ICD-10-CM | POA: Diagnosis not present

## 2017-10-07 DIAGNOSIS — Q150 Congenital glaucoma: Secondary | ICD-10-CM | POA: Diagnosis not present

## 2017-10-07 DIAGNOSIS — H02403 Unspecified ptosis of bilateral eyelids: Secondary | ICD-10-CM | POA: Diagnosis not present

## 2017-11-04 ENCOUNTER — Encounter (HOSPITAL_COMMUNITY): Payer: Self-pay

## 2017-11-04 ENCOUNTER — Observation Stay (HOSPITAL_COMMUNITY)
Admission: EM | Admit: 2017-11-04 | Discharge: 2017-11-06 | Disposition: A | Discharge status: Home / Self Care | Payer: Medicare Other | Attending: Internal Medicine | Admitting: Internal Medicine

## 2017-11-04 ENCOUNTER — Other Ambulatory Visit: Payer: Self-pay

## 2017-11-04 DIAGNOSIS — I341 Nonrheumatic mitral (valve) prolapse: Secondary | ICD-10-CM | POA: Insufficient documentation

## 2017-11-04 DIAGNOSIS — Z23 Encounter for immunization: Secondary | ICD-10-CM | POA: Insufficient documentation

## 2017-11-04 DIAGNOSIS — K922 Gastrointestinal hemorrhage, unspecified: Secondary | ICD-10-CM

## 2017-11-04 DIAGNOSIS — R42 Dizziness and giddiness: Secondary | ICD-10-CM | POA: Diagnosis not present

## 2017-11-04 DIAGNOSIS — H409 Unspecified glaucoma: Secondary | ICD-10-CM | POA: Insufficient documentation

## 2017-11-04 DIAGNOSIS — K625 Hemorrhage of anus and rectum: Principal | ICD-10-CM | POA: Diagnosis present

## 2017-11-04 DIAGNOSIS — Z6833 Body mass index (BMI) 33.0-33.9, adult: Secondary | ICD-10-CM | POA: Diagnosis not present

## 2017-11-04 DIAGNOSIS — Z7982 Long term (current) use of aspirin: Secondary | ICD-10-CM | POA: Diagnosis not present

## 2017-11-04 DIAGNOSIS — Z86010 Personal history of colonic polyps: Secondary | ICD-10-CM | POA: Insufficient documentation

## 2017-11-04 DIAGNOSIS — Z79899 Other long term (current) drug therapy: Secondary | ICD-10-CM | POA: Insufficient documentation

## 2017-11-04 DIAGNOSIS — D500 Iron deficiency anemia secondary to blood loss (chronic): Secondary | ICD-10-CM | POA: Insufficient documentation

## 2017-11-04 DIAGNOSIS — Z853 Personal history of malignant neoplasm of breast: Secondary | ICD-10-CM | POA: Diagnosis not present

## 2017-11-04 DIAGNOSIS — K921 Melena: Secondary | ICD-10-CM | POA: Diagnosis not present

## 2017-11-04 LAB — URINALYSIS, ROUTINE W REFLEX MICROSCOPIC
BILIRUBIN URINE: NEGATIVE
Glucose, UA: NEGATIVE mg/dL
Hgb urine dipstick: NEGATIVE
KETONES UR: NEGATIVE mg/dL
Leukocytes, UA: NEGATIVE
NITRITE: NEGATIVE
PH: 5 (ref 5.0–8.0)
Protein, ur: NEGATIVE mg/dL
SPECIFIC GRAVITY, URINE: 1.026 (ref 1.005–1.030)

## 2017-11-04 LAB — CBC WITH DIFFERENTIAL/PLATELET
BASOS ABS: 0 10*3/uL (ref 0.0–0.1)
Basophils Relative: 0 %
Eosinophils Absolute: 0.1 10*3/uL (ref 0.0–0.7)
Eosinophils Relative: 2 %
HCT: 39.7 % (ref 36.0–46.0)
Hemoglobin: 12.9 g/dL (ref 12.0–15.0)
LYMPHS PCT: 33 %
Lymphs Abs: 1.3 10*3/uL (ref 0.7–4.0)
MCH: 30.2 pg (ref 26.0–34.0)
MCHC: 32.5 g/dL (ref 30.0–36.0)
MCV: 93 fL (ref 78.0–100.0)
MONO ABS: 0.1 10*3/uL (ref 0.1–1.0)
MONOS PCT: 3 %
Neutro Abs: 2.4 10*3/uL (ref 1.7–7.7)
Neutrophils Relative %: 62 %
PLATELETS: 227 10*3/uL (ref 150–400)
RBC: 4.27 MIL/uL (ref 3.87–5.11)
RDW: 14.7 % (ref 11.5–15.5)
WBC: 3.9 10*3/uL — ABNORMAL LOW (ref 4.0–10.5)

## 2017-11-04 LAB — BASIC METABOLIC PANEL
Anion gap: 8 (ref 5–15)
BUN: 10 mg/dL (ref 6–20)
CO2: 24 mmol/L (ref 22–32)
CREATININE: 0.89 mg/dL (ref 0.44–1.00)
Calcium: 9.1 mg/dL (ref 8.9–10.3)
Chloride: 110 mmol/L (ref 101–111)
GFR calc Af Amer: >60 mL/min (ref >60)
GLUCOSE: 171 mg/dL — AB (ref 65–99)
Potassium: 3.8 mmol/L (ref 3.5–5.1)
Sodium: 142 mmol/L (ref 135–145)

## 2017-11-04 LAB — LIPASE, BLOOD: Lipase: 28 U/L (ref 11–51)

## 2017-11-04 LAB — POC OCCULT BLOOD, ED: Fecal Occult Bld: POSITIVE — AB

## 2017-11-04 MED ORDER — ACETAMINOPHEN 650 MG RE SUPP: 650.0000 mg | Freq: Four times a day (QID) | RECTAL | Status: DC | PRN

## 2017-11-04 MED ORDER — LOTEPREDNOL ETABONATE 0.5 % OP SUSP
1.0000 [drp] | Freq: Two times a day (BID) | OPHTHALMIC | Status: DC
  Administered 2017-11-05 – 2017-11-06 (×3): 1 [drp] via OPHTHALMIC
  Filled 2017-11-04: qty 5

## 2017-11-04 MED ORDER — TIMOLOL MALEATE 0.5 % OP SOLG
1.0000 [drp] | Freq: Every day | OPHTHALMIC | Status: DC
  Administered 2017-11-04 – 2017-11-05 (×2): 1 [drp] via OPHTHALMIC
  Filled 2017-11-04: qty 5

## 2017-11-04 MED ORDER — ONDANSETRON HCL 4 MG/2ML IJ SOLN: 4.0000 mg | Freq: Four times a day (QID) | INTRAMUSCULAR | Status: DC | PRN

## 2017-11-04 MED ORDER — PNEUMOCOCCAL VAC POLYVALENT 25 MCG/0.5ML IJ INJ
0.5000 mL | INJECTION | INTRAMUSCULAR | Status: AC
  Administered 2017-11-05: 0.5 mL via INTRAMUSCULAR
  Filled 2017-11-04: qty 0.5

## 2017-11-04 MED ORDER — ONDANSETRON HCL 4 MG PO TABS: 4.0000 mg | ORAL_TABLET | Freq: Four times a day (QID) | ORAL | Status: DC | PRN

## 2017-11-04 MED ORDER — VITAMIN D 1000 UNITS PO TABS
1000.0000 [IU] | ORAL_TABLET | ORAL | Status: DC
  Administered 2017-11-05: 1000 [IU] via ORAL
  Filled 2017-11-04: qty 1

## 2017-11-04 MED ORDER — ACETAMINOPHEN 325 MG PO TABS: 650.0000 mg | ORAL_TABLET | Freq: Four times a day (QID) | ORAL | Status: DC | PRN

## 2017-11-04 MED ORDER — ADULT MULTIVITAMIN W/MINERALS CH
1.0000 | ORAL_TABLET | Freq: Every day | ORAL | Status: DC
  Administered 2017-11-05 – 2017-11-06 (×2): 1 via ORAL
  Filled 2017-11-04 (×2): qty 1

## 2017-11-04 MED ORDER — DIPHENHYDRAMINE HCL 25 MG PO TABS
25.0000 mg | ORAL_TABLET | Freq: Every morning | ORAL | Status: DC
  Filled 2017-11-04: qty 1

## 2017-11-04 MED ORDER — MECLIZINE HCL 25 MG PO TABS
12.5000 mg | ORAL_TABLET | Freq: Three times a day (TID) | ORAL | Status: DC | PRN
Start: 2017-11-04 — End: 2017-11-06

## 2017-11-04 MED ORDER — OMEGA-3-ACID ETHYL ESTERS 1 G PO CAPS
1.0000 g | ORAL_CAPSULE | ORAL | Status: DC
  Administered 2017-11-05: 1 g via ORAL
  Filled 2017-11-04: qty 1

## 2017-11-04 NOTE — ED Notes (Signed)
Ordered dinner tray.  

## 2017-11-04 NOTE — ED Provider Notes (Signed)
[facility] EMERGENCY DEPARTMENT Provider Note   CSN: [id number] Arrival date & time: 11/04/17  1517     History   Chief Complaint Chief Complaint  Patient presents with  . Rectal Bleeding    HPI Charlsie W Womac is a 77 y.o. female.  The history is provided by the patient.  Rectal Bleeding  Quality:  Bright red Amount:  Copious Duration:  5 hours Timing:  Intermittent Chronicity:  New Context: defecation   Similar prior episodes: no   Relieved by:  None tried Worsened by:  Defecation Ineffective treatments:  None tried Associated symptoms: dizziness and light-headedness   Associated symptoms: no abdominal pain and no fever   Risk factors: no anticoagulant use, no hx of colorectal cancer, no NSAID use and no steroid use     Past Medical History:  Diagnosis Date  . Arthritis   . Blindness    90%  . Breast cancer ([place]) 03/19/2007   right  . Choledocholithiasis with obstruction 11/16/2012  . GERD (gastroesophageal reflux disease)   . Glaucoma   . Herpes simplex type II infection 1999  . Hyperlipidemia   . Irritable bowel syndrome (IBS)   . Mitral valve prolapse   . Myasthenia gravis    eyelids weakness.     Patient Active Problem List   Diagnosis Date Noted  . Malignant neoplasm of lower-inner quadrant of right breast of female, estrogen receptor positive ([place]) 06/14/2013  . Vestibular labyrinthitis 04/03/2013  . Dizziness 04/01/2013  . Cholelithiasis 11/16/2012  . Choledocholithiasis with obstruction 11/16/2012  . Jaundice 11/16/2012  . Nausea and vomiting 11/14/2012  . Abdominal pain, right upper quadrant 11/14/2012  . Arthritis 12/20/2011  . Glaucoma 12/20/2011  . Hypercholesterolemia 12/20/2011    Past Surgical History:  Procedure Laterality Date  . AXILLARY NODE DISSECTION  05/06/2007   right  . BREAST LUMPECTOMY  04/06/2007   right  . CHOLECYSTECTOMY N/A 11/18/2012   Procedure: LAPAROSCOPIC CHOLECYSTECTOMY WITH INTRAOPERATIVE  CHOLANGIOGRAM;  Surgeon: Madilyn Hook, DO;  Location: [facility];  Service: General;  Laterality: N/A;  . ERCP N/A 11/16/2012   Procedure: ENDOSCOPIC RETROGRADE CHOLANGIOPANCREATOGRAPHY (ERCP);  Surgeon: Gatha Mayer, MD;  Location: [facility];  Service: Endoscopy;  Laterality: N/A;  . ERCP  11/16/2012  . EYE SURGERY  1993  . KNEE SURGERY  2011  . TONSILLECTOMY  77 YEARS OLD  . TOTAL ABDOMINAL HYSTERECTOMY W/ BILATERAL SALPINGOOPHORECTOMY    . VARICOSE VEIN SURGERY  70S    OB History    No data available       Home Medications    Prior to Admission medications   Medication Sig Start Date End Date Taking? Authorizing Provider  aspirin 81 MG chewable tablet Chew 81 mg by mouth daily.   Yes [provider]  cholecalciferol (VITAMIN D) 1000 UNITS tablet Take 1 tablet (1,000 Units total) by mouth daily. Patient taking differently: Take 1,000 Units by mouth every other day.  06/25/15  Yes Magrinat, Virgie Dad, MD  diphenhydrAMINE (BENADRYL) 25 MG tablet Take 25 mg by mouth every morning.    Yes [provider]  Loteprednol Etabonate (LOTEMAX) 0.5 % GEL Place 1 drop into the right eye 2 (two) times daily.   Yes [provider]  meclizine (ANTIVERT) 12.5 MG tablet Take 1 tablet (12.5 mg total) by mouth 3 (three) times daily as needed for dizziness. 06/16/16  Yes Quintella Reichert, MD  Multiple Vitamin (MULTIVITAMIN WITH MINERALS) TABS Take 1 tablet by mouth daily.  Yes [provider]  naproxen sodium (ALEVE) 220 MG tablet Take 220-440 mg by mouth 2 (two) times daily as needed (for pain).    Yes [provider]  omega-3 acid ethyl esters (LOVAZA) 1 g capsule Take 1 g by mouth every other day.   Yes [provider]  timolol (TIMOPTIC-XR) 0.5 % ophthalmic gel-forming Place 1 drop into the right eye at bedtime.   Yes [provider]  vitamin C (ASCORBIC ACID) 500 MG tablet Take 500 mg by mouth daily.   Yes [provider]    Family  History Family History  Problem Relation Age of Onset  . Cancer Mother   . Hypertension Sister     Social History Social History   Tobacco Use  . Smoking status: Never Smoker  . Smokeless tobacco: Never Used  Substance Use Topics  . Alcohol use: No  . Drug use: No     Allergies   Pravachol   Review of Systems Review of Systems  Constitutional: Negative for fever.  Gastrointestinal: Positive for hematochezia. Negative for abdominal pain.  Neurological: Positive for dizziness and light-headedness.  All other systems reviewed and are negative.    Physical Exam Updated Vital Signs BP 118/66   Pulse 94   Temp 98.4 F (36.9 C)   Resp 16   Ht 5\' 5"  (1.651 m)   Wt 96.2 kg (212 lb)   SpO2 98%   BMI 35.28 kg/m   Physical Exam  Constitutional: She is oriented to person, place, and time. She appears well-developed and well-nourished. No distress.  HENT:  Head: Normocephalic and atraumatic.  Mouth/Throat: Oropharynx is clear and moist.  Eyes: Conjunctivae and EOM are normal. Pupils are equal, round, and reactive to light.  Neck: Normal range of motion. Neck supple.  Cardiovascular: Normal rate, regular rhythm and intact distal pulses.  No murmur heard. Pulmonary/Chest: Effort normal and breath sounds normal. No respiratory distress. She has no wheezes. She has no rales.  Abdominal: Soft. She exhibits no distension. There is no tenderness. There is no rebound and no guarding.  Genitourinary: Rectal exam shows guaiac positive stool.  Musculoskeletal: Normal range of motion. She exhibits no edema or tenderness.  Neurological: She is alert and oriented to person, place, and time.  Skin: Skin is warm and dry. No rash noted. No erythema.  Psychiatric: She has a normal mood and affect. Her behavior is normal.  Nursing note and vitals reviewed.    ED Treatments / Results  Labs (all labs ordered are listed, but only abnormal results are displayed) Labs Reviewed  CBC  WITH DIFFERENTIAL/PLATELET - Abnormal; Notable for the following components:      Result Value   WBC 3.9 (*)    All other components within normal limits  BASIC METABOLIC PANEL - Abnormal; Notable for the following components:   Glucose, Bld 171 (*)    All other components within normal limits  POC OCCULT BLOOD, ED - Abnormal; Notable for the following components:   Fecal Occult Bld POSITIVE (*)    All other components within normal limits  URINALYSIS, ROUTINE W REFLEX MICROSCOPIC  LIPASE, BLOOD    EKG  EKG Interpretation None       Radiology No results found.  Procedures Procedures (including critical care time)  Medications Ordered in ED Medications  cholecalciferol (VITAMIN D) tablet 1,000 Units (not administered)  diphenhydrAMINE (BENADRYL) tablet 25 mg (not administered)  Loteprednol Etabonate 0.5 % GEL 1 drop (not administered)  meclizine (ANTIVERT)  tablet 12.5 mg (not administered)  timolol (TIMOPTIC-XR) 0.5 % ophthalmic gel-forming 1 drop (not administered)  multivitamin with minerals tablet 1 tablet (not administered)  omega-3 acid ethyl esters (LOVAZA) capsule 1 g (not administered)     Initial Impression / Assessment and Plan / ED Course  I have reviewed the triage vital signs and the nursing notes.  Pertinent labs & imaging results that were available during my care of the patient were reviewed by me and considered in my medical decision making (see chart for details).     Patient is a 77 year old female who is relatively healthy but has had a prior history of breast cancer presenting today with 5 hours of GI bleeding.  Patient has had 6 episodes of bright bloody stool per rectum in the last 5 hours.  Last episode about an hour and a half ago.  Patient denies any abdominal pain but states after multiple bowel movements she is starting to feel woozy and lightheaded with standing.  She denies any chest pain or shortness of breath.  She takes a baby aspirin but  no other anticoagulants.  She has never had this issue before.  She saw gastroenterology within the last 5-10 years and her last colonoscopy was clear.  She is not having any pain with defecation and on exam she has no evidence of external hemorrhoid.  She is hemodynamically stable.  She is Hemoccult positive.  Initial hemoglobin is stable at 12.  Spoke with Dr. Fuller Plan with [facility] and they will see her in the morning.  She was admitted to the hospitalist service.\  Final Clinical Impressions(s) / ED Diagnoses   Final diagnoses:  Lower GI bleed    ED Discharge Orders    None       Blanchie Dessert, MD 11/04/17 [place]

## 2017-11-04 NOTE — H&P (Signed)
History and Physical    Angela [name] YKZ:[id number] DOB: [birth year] DOA: 11/04/2017  PCP: [name], MD  Patient coming from: Home  I have personally briefly reviewed patient's old medical records in [place]  Chief Complaint: Rectal bleeding  HPI: Angela [name] is a 77 y.o. female with medical history significant of blindness, BRCA in remission for 10 years now.  Patient presents to the ED with c/o BRBPR.  Symptoms onset some 5 hours ago with BM.  No abd pain.  6 episodes in past 5 hours PTA, none since arrival in ED.  Takes baby ASA but no other blood thinners.  Last colonoscopy was within past 5-10 years.   ED Course: Hgb 12.x.   Review of Systems: As per HPI otherwise 10 point review of systems negative.   Past Medical History:  Diagnosis Date  . Arthritis   . Blindness    90%  . Breast cancer ([place]) 03/19/2007   right  . Choledocholithiasis with obstruction 11/16/2012  . GERD (gastroesophageal reflux disease)   . Glaucoma   . Herpes simplex type II infection 1999  . Hyperlipidemia   . Irritable bowel syndrome (IBS)   . Mitral valve prolapse   . Myasthenia gravis    eyelids weakness.     Past Surgical History:  Procedure Laterality Date  . AXILLARY NODE DISSECTION  05/06/2007   right  . BREAST LUMPECTOMY  04/06/2007   right  . CHOLECYSTECTOMY N/A 11/18/2012   Procedure: LAPAROSCOPIC CHOLECYSTECTOMY WITH INTRAOPERATIVE CHOLANGIOGRAM;  Surgeon: Madilyn Hook, DO;  Location: [facility];  Service: General;  Laterality: N/A;  . ERCP N/A 11/16/2012   Procedure: ENDOSCOPIC RETROGRADE CHOLANGIOPANCREATOGRAPHY (ERCP);  Surgeon: Gatha Mayer, MD;  Location: [facility];  Service: Endoscopy;  Laterality: N/A;  . ERCP  11/16/2012  . EYE SURGERY  1993  . KNEE SURGERY  2011  . TONSILLECTOMY  77 YEARS OLD  . TOTAL ABDOMINAL HYSTERECTOMY W/ BILATERAL SALPINGOOPHORECTOMY    . VARICOSE VEIN SURGERY  70S     reports that  has never smoked. she has never used smokeless tobacco.  She reports that she does not drink alcohol or use drugs.  Allergies  Allergen Reactions  . Pravachol Rash    Family History  Problem Relation Age of Onset  . Cancer Mother   . Hypertension Sister      Prior to Admission medications   Medication Sig Start Date End Date Taking? Authorizing Provider  aspirin 81 MG chewable tablet Chew 81 mg by mouth daily.   Yes [provider]  cholecalciferol (VITAMIN D) 1000 UNITS tablet Take 1 tablet (1,000 Units total) by mouth daily. Patient taking differently: Take 1,000 Units by mouth every other day.  06/25/15  Yes Magrinat, Virgie Dad, MD  diphenhydrAMINE (BENADRYL) 25 MG tablet Take 25 mg by mouth every morning.    Yes [provider]  Loteprednol Etabonate (LOTEMAX) 0.5 % GEL Place 1 drop into the right eye 2 (two) times daily.   Yes [provider]  meclizine (ANTIVERT) 12.5 MG tablet Take 1 tablet (12.5 mg total) by mouth 3 (three) times daily as needed for dizziness. 06/16/16  Yes Quintella Reichert, MD  Multiple Vitamin (MULTIVITAMIN WITH MINERALS) TABS Take 1 tablet by mouth daily.    Yes [provider]  naproxen sodium (ALEVE) 220 MG tablet Take 220-440 mg by mouth 2 (two) times daily as needed (for pain).    Yes [provider]  omega-3 acid ethyl esters (LOVAZA)  1 g capsule Take 1 g by mouth every other day.   Yes [provider]  timolol (TIMOPTIC-XR) 0.5 % ophthalmic gel-forming Place 1 drop into the right eye at bedtime.   Yes [provider]  vitamin C (ASCORBIC ACID) 500 MG tablet Take 500 mg by mouth daily.   Yes [provider]    Physical Exam: Vitals:   11/04/17 1611 11/04/17 1818 11/04/17 1833  BP: 114/90 106/62 118/66  Pulse: (!) 112 (!) 105 94  Resp: 18 16   Temp: 98.4 F (36.9 C)    SpO2: 97% 100% 98%  Weight: 96.2 kg (212 lb)    Height: '5\' 5"'$  (1.651 m)      Constitutional: NAD, calm, comfortable Eyes: PERRL, lids and conjunctivae  normal ENMT: Mucous membranes are moist. Posterior pharynx clear of any exudate or lesions.Normal dentition.  Neck: normal, supple, no masses, no thyromegaly Respiratory: clear to auscultation bilaterally, no wheezing, no crackles. Normal respiratory effort. No accessory muscle use.  Cardiovascular: Regular rate and rhythm, no murmurs / rubs / gallops. No extremity edema. 2+ pedal pulses. No carotid bruits.  Abdomen: no tenderness, no masses palpated. No hepatosplenomegaly. Bowel sounds positive.  Musculoskeletal: no clubbing / cyanosis. No joint deformity upper and lower extremities. Good ROM, no contractures. Normal muscle tone.  Skin: no rashes, lesions, ulcers. No induration Neurologic: CN 2-12 grossly intact. Sensation intact, DTR normal. Strength 5/5 in all 4.  Psychiatric: Normal judgment and insight. Alert and oriented x 3. Normal mood.    Labs on Admission: I have personally reviewed following labs and imaging studies  CBC: Recent Labs  Lab 11/04/17 1624  WBC 3.9*  NEUTROABS 2.4  HGB 12.9  HCT 39.7  MCV 93.0  PLT 270   Basic Metabolic Panel: Recent Labs  Lab 11/04/17 1624  NA 142  K 3.8  CL 110  CO2 24  GLUCOSE 171*  BUN 10  CREATININE 0.89  CALCIUM 9.1   GFR: Estimated Creatinine Clearance: 62.7 mL/min (by C-G formula based on SCr of 0.89 mg/dL). Liver Function Tests: No results for input(s): AST, ALT, ALKPHOS, BILITOT, PROT, ALBUMIN in the last 168 hours. Recent Labs  Lab 11/04/17 1624  LIPASE 28   No results for input(s): AMMONIA in the last 168 hours. Coagulation Profile: No results for input(s): INR, PROTIME in the last 168 hours. Cardiac Enzymes: No results for input(s): CKTOTAL, CKMB, CKMBINDEX, TROPONINI in the last 168 hours. BNP (last 3 results) No results for input(s): PROBNP in the last 8760 hours. HbA1C: No results for input(s): HGBA1C in the last 72 hours. CBG: No results for input(s): GLUCAP in the last 168 hours. Lipid Profile: No  results for input(s): CHOL, HDL, LDLCALC, TRIG, CHOLHDL, LDLDIRECT in the last 72 hours. Thyroid Function Tests: No results for input(s): TSH, T4TOTAL, FREET4, T3FREE, THYROIDAB in the last 72 hours. Anemia Panel: No results for input(s): VITAMINB12, FOLATE, FERRITIN, TIBC, IRON, RETICCTPCT in the last 72 hours. Urine analysis:    Component Value Date/Time   COLORURINE YELLOW 11/04/2017 1826   APPEARANCEUR CLEAR 11/04/2017 1826   LABSPEC 1.026 11/04/2017 1826   PHURINE 5.0 11/04/2017 1826   GLUCOSEU NEGATIVE 11/04/2017 1826   HGBUR NEGATIVE 11/04/2017 1826   BILIRUBINUR NEGATIVE 11/04/2017 1826   KETONESUR NEGATIVE 11/04/2017 1826   PROTEINUR NEGATIVE 11/04/2017 1826   UROBILINOGEN 0.2 04/01/2013 1812   NITRITE NEGATIVE 11/04/2017 1826   LEUKOCYTESUR NEGATIVE 11/04/2017 1826    Radiological Exams on Admission: No results found.  EKG: Independently  reviewed.  Assessment/Plan Active Problems:   BRBPR (bright red blood per rectum)    1. BRBPR - 1. Tele monitor 2. Repeat CBC in AM 3. Clear liquid diet 4. GI to see in AM 2. H/o BRCA - in remission  DVT prophylaxis: SCDs Code Status: Full Family Communication: No family in room Disposition Plan: Home after admit Consults called: EDP spoke with Dr. Fuller Plan Admission status: Place in [place], [facility] Pager [phone]  If 7AM-7PM, please contact day team taking care of patient [email or web address] Password [facility]  11/04/2017, 7:26 PM

## 2017-11-04 NOTE — ED Triage Notes (Signed)
Pt presents with onset of bleeding from rectum or urethra, pt is not sure.  Pt denies any rectal pain or pain when she voids, reports bright red blood only when she uses the bathroom.  Pt reports umbilical pain yesterday, denies at present.  Reports pressure yesterday when she voided.

## 2017-11-05 DIAGNOSIS — K921 Melena: Secondary | ICD-10-CM | POA: Diagnosis not present

## 2017-11-05 DIAGNOSIS — K625 Hemorrhage of anus and rectum: Secondary | ICD-10-CM | POA: Diagnosis not present

## 2017-11-05 DIAGNOSIS — K922 Gastrointestinal hemorrhage, unspecified: Secondary | ICD-10-CM | POA: Diagnosis not present

## 2017-11-05 LAB — CBC
HEMATOCRIT: 35.1 % — AB (ref 36.0–46.0)
HEMOGLOBIN: 11.5 g/dL — AB (ref 12.0–15.0)
MCH: 30.3 pg (ref 26.0–34.0)
MCHC: 32.8 g/dL (ref 30.0–36.0)
MCV: 92.6 fL (ref 78.0–100.0)
Platelets: 198 10*3/uL (ref 150–400)
RBC: 3.79 MIL/uL — ABNORMAL LOW (ref 3.87–5.11)
RDW: 14.6 % (ref 11.5–15.5)
WBC: 4.1 10*3/uL (ref 4.0–10.5)

## 2017-11-05 LAB — BASIC METABOLIC PANEL
Anion gap: 9 (ref 5–15)
BUN: 16 mg/dL (ref 6–20)
CALCIUM: 8.7 mg/dL — AB (ref 8.9–10.3)
CHLORIDE: 110 mmol/L (ref 101–111)
CO2: 22 mmol/L (ref 22–32)
CREATININE: 0.88 mg/dL (ref 0.44–1.00)
GFR calc non Af Amer: >60 mL/min (ref >60)
Glucose, Bld: 116 mg/dL — ABNORMAL HIGH (ref 65–99)
Potassium: 4 mmol/L (ref 3.5–5.1)
SODIUM: 141 mmol/L (ref 135–145)

## 2017-11-05 MED ORDER — DIPHENHYDRAMINE HCL 25 MG PO CAPS
25.0000 mg | ORAL_CAPSULE | Freq: Every morning | ORAL | Status: DC
  Administered 2017-11-05 – 2017-11-06 (×2): 25 mg via ORAL
  Filled 2017-11-05 (×2): qty 1

## 2017-11-05 NOTE — Consult Note (Addendum)
[facility] Consult: 8:25 AM 11/05/2017  LOS: 0 days    Referring Provider: Dr Tana Coast  Primary Care Physician:  [name], MD Primary Gastroenterologist:  Dr. Deatra Ina.     Reason for Consultation:  Painless Hematochezia   HPI: Angela [name] is a 77 y.o. female.  PMH breast cancer (surgery, radiation, chemo, anastrozole) 2008 in remission.  Blind in OS from glaucoma, decreased vision OD.  She can still see colors well and live independently.  Osteoporosis.  2016 robotic sacrocolpopexy, LOA, cystoscopy for bladder vs vaginal prolapse.  Obesity. IBS.  Adenomatous colon polyps 2012.  Diverticulosis.  Hemorrhoids. GERD.  Dilation of esophageal stricture 1998, 2001.  Gastric ulcer 1998.  Gallstone pancreatitis 2014.  10/2012 Lap chole with + IOC for CBD stone.   06/2011 Colonoscopy, surveillance study for adenomatous polyps >10 yrs past.  Diverticulosis, internal/external  rrhoids.  10/2012 ERCP: with sphinct and stone extraction.    First of 5 episodes of bleeding occurred ~ noon yesterday, last episode was 10 mins or so before arrival in ED.  Blood was dark red, clotted at times.  No significant abd pain.  Some dizziness yesterday, none today.  She feels good and ate solid AM meal today.  No previous hx of BPR, hematochezia, anemia, blood transfusion.  No dysphagia.  No heartburn.  Home meds include 81 ASA, no PPI/H2B.  Had minor upset stomach starting 1 month ago after stopping (Anastrozole?).  Taking 1 Benadryl in AM and stomach upset resolved.  Takes Aleve rarely, at most 1x month, same for Ibuprofen but took 400 mg of this last week.    In ED soft BPs 106 - 118/62-90.  HR 112 initially, now in 80s.   Hgb 12.9 >> 11.5.  Was 13.9 on 08/05/17.  Normal MCV, platelets, BUN.   This AM, after my exam she had BM: black/old  blood at first then brown stool at the end.      Fm Hx: Her mother, a smoker died of lung cancer.  2 maternal uncles had colon cancer.  Maternal aunt had stomach cancer.  Past Medical History:  Diagnosis Date  . Arthritis   . Blindness    90%  . Breast cancer ([place]) 03/19/2007   right  . Choledocholithiasis with obstruction 11/16/2012  . GERD (gastroesophageal reflux disease)   . Glaucoma   . Herpes simplex type II infection 1999  . Hyperlipidemia   . Irritable bowel syndrome (IBS)   . Mitral valve prolapse   . Myasthenia gravis    eyelids weakness.     Past Surgical History:  Procedure Laterality Date  . AXILLARY NODE DISSECTION  05/06/2007   right  . BREAST LUMPECTOMY  04/06/2007   right  . CHOLECYSTECTOMY N/A 11/18/2012   Procedure: LAPAROSCOPIC CHOLECYSTECTOMY WITH INTRAOPERATIVE CHOLANGIOGRAM;  Surgeon: Madilyn Hook, DO;  Location: [facility];  Service: General;  Laterality: N/A;  . ERCP N/A 11/16/2012   Procedure: ENDOSCOPIC RETROGRADE CHOLANGIOPANCREATOGRAPHY (ERCP);  Surgeon: Gatha Mayer, MD;  Location: [facility];  Service: Endoscopy;  Laterality: N/A;  . ERCP  11/16/2012  . EYE SURGERY  1993  . KNEE SURGERY  2011  . TONSILLECTOMY  77 YEARS OLD  . TOTAL ABDOMINAL HYSTERECTOMY W/ BILATERAL SALPINGOOPHORECTOMY    . VARICOSE VEIN SURGERY  70S    Prior to Admission medications   Medication Sig Start Date End Date Taking? Authorizing Provider  aspirin 81 MG chewable tablet Chew 81 mg by mouth daily.   Yes [provider]  cholecalciferol (VITAMIN D) 1000 UNITS tablet Take 1 tablet (1,000 Units total) by mouth daily. Patient taking differently: Take 1,000 Units by mouth every other day.  06/25/15  Yes Magrinat, Virgie Dad, MD  diphenhydrAMINE (BENADRYL) 25 MG tablet Take 25 mg by mouth every morning.    Yes [provider]  Loteprednol Etabonate (LOTEMAX) 0.5 % GEL Place 1 drop into the right eye 2 (two) times daily.   Yes [provider]  meclizine  (ANTIVERT) 12.5 MG tablet Take 1 tablet (12.5 mg total) by mouth 3 (three) times daily as needed for dizziness. 06/16/16  Yes Quintella Reichert, MD  Multiple Vitamin (MULTIVITAMIN WITH MINERALS) TABS Take 1 tablet by mouth daily.    Yes [provider]  naproxen sodium (ALEVE) 220 MG tablet Take 220-440 mg by mouth 2 (two) times daily as needed (for pain).    Yes [provider]  omega-3 acid ethyl esters (LOVAZA) 1 g capsule Take 1 g by mouth every other day.   Yes [provider]  timolol (TIMOPTIC-XR) 0.5 % ophthalmic gel-forming Place 1 drop into the right eye at bedtime.   Yes [provider]  vitamin C (ASCORBIC ACID) 500 MG tablet Take 500 mg by mouth daily.   Yes [provider]    Scheduled Meds: . cholecalciferol  1,000 Units Oral QODAY  . diphenhydrAMINE  25 mg Oral q morning - 10a  . loteprednol  1 drop Right Eye BID  . multivitamin with minerals  1 tablet Oral Daily  . omega-3 acid ethyl esters  1 g Oral QODAY  . pneumococcal 23 valent vaccine  0.5 mL Intramuscular Tomorrow-1000  . timolol  1 drop Right Eye QHS   Infusions:  PRN Meds: acetaminophen **OR** acetaminophen, meclizine, ondansetron **OR** ondansetron (ZOFRAN) IV   Allergies as of 11/04/2017 - Review Complete 11/04/2017  Allergen Reaction Noted  . Pravachol Rash     Family History  Problem Relation Age of Onset  . Cancer Mother   . Hypertension Sister     Social History   Socioeconomic History  . Marital status: Married    Spouse name: Not on file  . Number of children: Not on file  . Years of education: Not on file  . Highest education level: Not on file  Social Needs  . Financial resource strain: Not on file  . Food insecurity - worry: Not on file  . Food insecurity - inability: Not on file  . Transportation needs - medical: Not on file  . Transportation needs - non-medical: Not on file  Occupational History  . Not on file  Tobacco Use  . Smoking  status: Never Smoker  . Smokeless tobacco: Never Used  Substance and Sexual Activity  . Alcohol use: No  . Drug use: No  . Sexual activity: Not on file  Other Topics Concern  . Not on file  Social History Narrative  . Not on file    REVIEW OF SYSTEMS: Constitutional: Patient generally has good energy.  Over the past 8-10 years her weight has gone  from around 190# to 212/215#.  With the weight gain her abdominal girth has increased. ENT:  No nose bleeds.  Just had her teeth cleaned yesterday morning.  Wears partial denture in upper and lower jaw Pulm: No shortness of breath or cough.  Asthma. CV:  No palpitations, no LE edema.  GU:  No hematuria, no frequency, no incontinence GI:  Per HPI Heme: No unusual bleeding or bruising.  No history of anemia. Transfusions: Previous history transfusions. Neuro: Some dizziness yesterday after several of the bleeding episodes but this has resolved.  She is blind in her left eye due to glaucoma.. Derm:  No itching, no rash or sores.  Endocrine:  No sweats or chills.  No polyuria or dysuria Immunization: I see she just had her pneumococcal vaccine this morning.  Do not know about flu shot, did not ask her. Travel:  None beyond local counties in last few months.    PHYSICAL EXAM: Vital signs in last 24 hours: Vitals:   11/04/17 1833 11/04/17 2008  BP: 118/66 114/67  Pulse: 94 82  Resp:  18  Temp:  98.1 F (36.7 C)  SpO2: 98% 100%   Wt Readings from Last 3 Encounters:  11/04/17 209 lb 14.1 oz (95.2 kg)  08/05/17 211 lb (95.7 kg)  06/25/16 212 lb 6.4 oz (96.3 kg)    General: Pleasant, calm, comfortable, well-appearing older AAF Head: No facial asymmetry or swelling.  No signs of head trauma. Eyes: No scleral icterus.  No conjunctival pallor.  EOMI. Ears: Not hard of hearing. Nose: No discharge or congestion. Mouth: Good dental repair.  Some missing teeth.  Tongue midline.  Oral mucosa pink, moist, clear. Neck: No JVD, no thyromegaly.   No masses. Lungs: No labored breathing or cough.  Lungs clear bilaterally with good breath sounds throughout. Heart: RRR.  No MRG.  S1, S2 present. Abdomen: Obese, soft.  Not distended or tender.  No HSM, masses, hernias..   Rectal: Visible small external hemorrhoidal tags.  No visible or palpable lesions.  Scant bit of burgundy colored material on exam glove. Musc/Skeltl: No joint redness or swelling.  No contracture deformities. Extremities: No CCE.  Feet are warm. Neurologic: Fully alert.  Oriented x3.  Wearing glasses.  Does not seem to have any problems moving around the room despite her diminished vision. Skin: No rashes, no sores or suspicious lesions. Tattoos: None  Nodes: No cervical adenopathy. Psych: Very pleasant, calm, cooperative.  Good, detailed historian.  Intake/Output from previous day: No intake/output data recorded. Intake/Output this shift: No intake/output data recorded.  LAB RESULTS: Recent Labs    11/04/17 1624 11/05/17 0412  WBC 3.9* 4.1  HGB 12.9 11.5*  HCT 39.7 35.1*  PLT 227 198   BMET Lab Results  Component Value Date   NA 141 11/05/2017   NA 142 11/04/2017   NA 141 08/05/2017   K 4.0 11/05/2017   K 3.8 11/04/2017   K 4.2 08/05/2017   CL 110 11/05/2017   CL 110 11/04/2017   CL 104 06/16/2016   CO2 22 11/05/2017   CO2 24 11/04/2017   CO2 26 08/05/2017   GLUCOSE 116 (H) 11/05/2017   GLUCOSE 171 (H) 11/04/2017   GLUCOSE 101 08/05/2017   BUN 16 11/05/2017   BUN 10 11/04/2017   BUN 14.1 08/05/2017   CREATININE 0.88 11/05/2017   CREATININE 0.89 11/04/2017   CREATININE 0.8 08/05/2017   CALCIUM 8.7 (L) 11/05/2017   CALCIUM 9.1 11/04/2017   CALCIUM 9.4 08/05/2017  LFT No results for input(s): PROT, ALBUMIN, AST, ALT, ALKPHOS, BILITOT, BILIDIR, IBILI in the last 72 hours. PT/INR No results found for: INR, PROTIME Lipase     Component Value Date/Time   LIPASE 28 11/04/2017 1624    RADIOLOGY STUDIES: No results  found.   IMPRESSION:   *   Painless hematochezia.  Likely Diverticular bleed.  Established Dx of diverticulosis, hemorrhoids on past colonoscopies, last was in 2012.  Hx adenomatous polyps at or before 2002 (no path or endoscopic record pinpointing exact date).  Bleeding appears to have resolved.    *  Minor blood loss anemia.  Normcocytic.    *  Hx GERD, esophageal strictures, dysphagia: no sxs currently though recently taking Benadryl for upset stomach and it is working.  Wonder if the upset is due to sinus drainage.    *  Significant vision loss due to glaucoma.     PLAN:     *   Case discussed with Dr. Silverio Decamp.  Plan is to continue observation.  Continue regular diet which she got this AM despite order for clears.  CBC in AM.  If no further bleeding may go home tomorrow.    Stop 81 mg ASA.  Given no risk factors for or hx of CAD, ASA is not indicated anyway.   Did not add PPI or H2B as she is not having reflux sxs.     Azucena Freed  11/05/2017, 8:25 AM Pager: [phone]   Attending physician's note   I have taken a history, examined the patient and reviewed the chart. I agree with the Advanced Practitioner's note, impression and recommendations.  77 year old female with morbid obesity, history of breast cancer presented with hematochezia, likely source of diverticular hemorrhage.  No further episodes since admission. Last colonoscopy in October 2012 showed diverticulosis, internal and external hemorrhoids, otherwise was normal.   Hemodynamically stable. Monitor hemoglobin and transfuse as needed If no further bleeding, okay to discharge home tomorrow  K Denzil Magnuson, MD [phone] Mon-Fri 8a-5p [phone] after 5p, weekends, holidays

## 2017-11-05 NOTE — Progress Notes (Signed)
[facility]                                                                              Patient Demographics  Angela [name], is a 77 y.o. female, DOB - 06/23/1941, KNL:[id number]  Admit date - 11/04/2017   Admitting Physician Etta Quill, DO  Outpatient Primary MD for the patient is [name], MD  Outpatient specialists:   LOS - 0  days   Medical records reviewed and are as summarized below:    Chief Complaint  Patient presents with  . Rectal Bleeding       Brief summary   Patient is a 77 year old female with irritable bowel syndrome, history of right breast cancer (in remission), blindness, presented with rectal bleeding. Denies any abdominal pain, had 6 episodes prior to admission. Takes baby aspirin otherwise no antiplatelet medication. Last colonoscopy more than 5 years ago.   Assessment & Plan    Principal Problem:   BRBPR (bright red blood per rectum)/painless hematochezia possibly diverticular bleed - Patient had last colonoscopy in 2012, had diverticulosis. - GI consulted, no further bleeding - Per GI, observation for now, hold aspirin - If patient is stable and no further bleeding, possible DC home in a.m.   History of right breast CA - In remission and outpatient follow-up with oncologist   Code Status: Full CODE STATUS DVT Prophylaxis:  SCD's Family Communication: Discussed in detail with the patient, all imaging results, lab results explained to the patient and son at the bedside   Disposition Plan: Possible DC home in a.m. if H&H is stable, no further bleeding   Time Spent in minutes  25 minutes  Procedures:  None   Consultants:   Gastroenterology  Antimicrobials:      Medications  Scheduled Meds: . cholecalciferol  1,000 Units Oral QODAY  . diphenhydrAMINE  25 mg Oral q morning - 10a  . loteprednol  1 drop Right Eye BID  . multivitamin with minerals  1 tablet Oral Daily  . omega-3 acid ethyl esters  1 g  Oral QODAY  . timolol  1 drop Right Eye QHS   Continuous Infusions: PRN Meds:.acetaminophen **OR** acetaminophen, meclizine, ondansetron **OR** ondansetron (ZOFRAN) IV   Antibiotics   Anti-infectives (From admission, onward)   None        Subjective:   Mirenda Mahler was seen and examined today.  Feels better, denies any repeat bleeding since admission. Patient denies dizziness, chest pain, shortness of breath, abdominal pain, N/V, new weakness, numbess, tingling.   Objective:   Vitals:   11/04/17 1818 11/04/17 1833 11/04/17 2008 11/04/17 2010  BP: 106/62 118/66 114/67   Pulse: (!) 105 94 82   Resp: 16  18   Temp:   98.1 F (36.7 C)   SpO2: 100% 98% 100%   Weight:    95.2 kg (209 lb 14.1 oz)  Height:    5\' 6"  (1.676 m)    Intake/Output Summary (Last 24 hours) at 11/05/2017 1221 Last data filed at 11/05/2017 0904 Gross per 24 hour  Intake 240 ml  Output -  Net 240 ml     Wt Readings  from Last 3 Encounters:  11/04/17 95.2 kg (209 lb 14.1 oz)  08/05/17 95.7 kg (211 lb)  06/25/16 96.3 kg (212 lb 6.4 oz)     Exam  General: Alert and oriented x 3, NAD  Eyes:   HEENT:  Atraumatic, normocephalic  Cardiovascular: S1 S2 auscultated, Regular rate and rhythm.  Respiratory: Clear to auscultation bilaterally, no wheezing, rales or rhonchi  Gastrointestinal: Soft, nontender, nondistended, + bowel sounds  Ext: no pedal edema bilaterally  Neuro: AAOx3, Cr N's II- XII. Strength 5/5 upper and lower extremities bilaterally  Musculoskeletal: No digital cyanosis, clubbing  Skin: No rashes  Psych: Normal affect and demeanor, alert and oriented x3    Data Reviewed:  I have personally reviewed following labs and imaging studies  Micro Results No results found for this or any previous visit (from the past 240 hour(s)).  Radiology Reports No results found.  Lab Data:  CBC: Recent Labs  Lab 11/04/17 1624 11/05/17 0412  WBC 3.9* 4.1  NEUTROABS 2.4  --   HGB  12.9 11.5*  HCT 39.7 35.1*  MCV 93.0 92.6  PLT 227 846   Basic Metabolic Panel: Recent Labs  Lab 11/04/17 1624 11/05/17 0412  NA 142 141  K 3.8 4.0  CL 110 110  CO2 24 22  GLUCOSE 171* 116*  BUN 10 16  CREATININE 0.89 0.88  CALCIUM 9.1 8.7*   GFR: Estimated Creatinine Clearance: 64.3 mL/min (by C-G formula based on SCr of 0.88 mg/dL). Liver Function Tests: No results for input(s): AST, ALT, ALKPHOS, BILITOT, PROT, ALBUMIN in the last 168 hours. Recent Labs  Lab 11/04/17 1624  LIPASE 28   No results for input(s): AMMONIA in the last 168 hours. Coagulation Profile: No results for input(s): INR, PROTIME in the last 168 hours. Cardiac Enzymes: No results for input(s): CKTOTAL, CKMB, CKMBINDEX, TROPONINI in the last 168 hours. BNP (last 3 results) No results for input(s): PROBNP in the last 8760 hours. HbA1C: No results for input(s): HGBA1C in the last 72 hours. CBG: No results for input(s): GLUCAP in the last 168 hours. Lipid Profile: No results for input(s): CHOL, HDL, LDLCALC, TRIG, CHOLHDL, LDLDIRECT in the last 72 hours. Thyroid Function Tests: No results for input(s): TSH, T4TOTAL, FREET4, T3FREE, THYROIDAB in the last 72 hours. Anemia Panel: No results for input(s): VITAMINB12, FOLATE, FERRITIN, TIBC, IRON, RETICCTPCT in the last 72 hours. Urine analysis:    Component Value Date/Time   COLORURINE YELLOW 11/04/2017 1826   APPEARANCEUR CLEAR 11/04/2017 1826   LABSPEC 1.026 11/04/2017 1826   PHURINE 5.0 11/04/2017 1826   GLUCOSEU NEGATIVE 11/04/2017 1826   HGBUR NEGATIVE 11/04/2017 1826   BILIRUBINUR NEGATIVE 11/04/2017 1826   KETONESUR NEGATIVE 11/04/2017 1826   PROTEINUR NEGATIVE 11/04/2017 1826   UROBILINOGEN 0.2 04/01/2013 1812   NITRITE NEGATIVE 11/04/2017 1826   LEUKOCYTESUR NEGATIVE 11/04/2017 1826     Derrian Rodak M.D. [facility] 11/05/2017, 12:21 PM  Pager: [phone] Between 7am to 7pm - call Pager - [phone]  After 7pm go to  [email or web address] - password [email or web address]  Call night coverage person covering after 7pm

## 2017-11-05 NOTE — Progress Notes (Signed)
Patient arrived to the unit via bed from the ED.  Patient is alert and oriented.  No complaints of pain.  IV intact to the Left Antecubital.  Skin assessment complete.  NO skin issues.  Placed the cardiac monitoring on the patient.  Educated the patient on how to reach the staff on the unit.  Placed the call light within reach.  Will continue to monitor the patient and notify as needed

## 2017-11-06 DIAGNOSIS — K625 Hemorrhage of anus and rectum: Secondary | ICD-10-CM | POA: Diagnosis not present

## 2017-11-06 LAB — CBC
HCT: 35.6 % — ABNORMAL LOW (ref 36.0–46.0)
HEMOGLOBIN: 11.4 g/dL — AB (ref 12.0–15.0)
MCH: 30.2 pg (ref 26.0–34.0)
MCHC: 32 g/dL (ref 30.0–36.0)
MCV: 94.4 fL (ref 78.0–100.0)
Platelets: 194 10*3/uL (ref 150–400)
RBC: 3.77 MIL/uL — ABNORMAL LOW (ref 3.87–5.11)
RDW: 15 % (ref 11.5–15.5)
WBC: 3.1 10*3/uL — ABNORMAL LOW (ref 4.0–10.5)

## 2017-11-06 NOTE — Discharge Summary (Signed)
Physician Discharge Summary  Angela [name] WNU:[id number] DOB: July 19, 1941 DOA: 11/04/2017  PCP: Angela Poll, MD  Admit date: 11/04/2017  Discharge date: 11/06/2017  Admitted From:Home  Disposition: Home   Recommendations for Outpatient Follow-up:  1. Follow up with PCP Dr. Fredderick [name] in 2 weeks 2. Please obtain CBC in two weeks  Home Health:N/A  Equipment/Devices:N/A  Discharge Condition:Stable  CODE STATUS: Full  Diet recommendation: Heart Healthy  Brief/Interim Summary:  Patient is a 77 year old female with irritable bowel syndrome, history of right breast cancer (in remission), blindness, presented with rectal bleeding. Denies any abdominal pain, had 6 episodes prior to admission. Takes baby aspirin otherwise no antiplatelet medication. Last colonoscopy was in 2012 with noted diverticulosis as well as hemorrhoids. While being monitored here, she has had no further bleeding episodes and has had stable hemoglobin level with hemoglobin level of 11.4 noted today on discharge. She has been evaluated by gastroenterology with no plans for any intervention noted at this time and has been tolerating her diet. No other acute events noted during this admission. She has been counseled on discontinuing aspirin as she has no need for this. She is to follow-up with her primary care physician Dr. Fredderick [name] in the next 2 weeks.   Discharge Diagnoses:  Principal Problem:   BRBPR (bright red blood per rectum)    Discharge Instructions  Discharge Instructions    Diet - low sodium heart healthy   Complete by:  As directed    Increase activity slowly   Complete by:  As directed      Allergies as of 11/06/2017      Reactions   Pravachol Rash      Medication List    STOP taking these medications   aspirin 81 MG chewable tablet     TAKE these medications   cholecalciferol 1000 units tablet Commonly known as:  VITAMIN D Take 1 tablet (1,000 Units total) by mouth daily. What changed:  when to  take this   diphenhydrAMINE 25 MG tablet Commonly known as:  BENADRYL Take 25 mg by mouth every morning.   LOTEMAX 0.5 % Gel Generic drug:  Loteprednol Etabonate Place 1 drop into the right eye 2 (two) times daily.   meclizine 12.5 MG tablet Commonly known as:  ANTIVERT Take 1 tablet (12.5 mg total) by mouth 3 (three) times daily as needed for dizziness.   multivitamin with minerals Tabs tablet Take 1 tablet by mouth daily.   naproxen sodium 220 MG tablet Commonly known as:  ALEVE Take 220-440 mg by mouth 2 (two) times daily as needed (for pain).   omega-3 acid ethyl esters 1 g capsule Commonly known as:  LOVAZA Take 1 g by mouth every other day.   timolol 0.5 % ophthalmic gel-forming Commonly known as:  TIMOPTIC-XR Place 1 drop into the right eye at bedtime.   vitamin C 500 MG tablet Commonly known as:  ASCORBIC ACID Take 500 mg by mouth daily.      Follow-up Information    Angela Poll, MD Follow up in 2 week(s).   Specialty:  Family Medicine Contact information: [address] [place] [address] [phone]          Allergies  Allergen Reactions  . Pravachol Rash    Consultations:  Dr. Silverio Decamp [facility]   Procedures/Studies:  No results found.   Discharge Exam: Vitals:   11/05/17 2132 11/06/17 0444  BP: 107/62 106/66  Pulse: 79 75  Resp: 18 16  Temp:  98.1 F (36.7 C) 98.1 F (36.7 C)  SpO2: 97% 100%   Vitals:   11/04/17 2010 11/05/17 1300 11/05/17 2132 11/06/17 0444  BP:  (!) 115/55 107/62 106/66  Pulse:  84 79 75  Resp:  16 18 16   Temp:  98 F (36.7 C) 98.1 F (36.7 C) 98.1 F (36.7 C)  TempSrc:  Oral Oral Oral  SpO2:  100% 97% 100%  Weight: 95.2 kg (209 lb 14.1 oz)     Height: 5\' 6"  (1.676 m)       General: Pt is alert, awake, not in acute distress Cardiovascular: RRR, S1/S2 +, no rubs, no gallops Respiratory: CTA bilaterally, no wheezing, no rhonchi Abdominal: Soft, NT, ND, bowel sounds + Extremities: no  edema, no cyanosis    The results of significant diagnostics from this hospitalization (including imaging, microbiology, ancillary and laboratory) are listed below for reference.     Microbiology: No results found for this or any previous visit (from the past 240 hour(s)).   Labs: BNP (last 3 results) No results for input(s): BNP in the last 8760 hours. Basic Metabolic Panel: Recent Labs  Lab 11/04/17 1624 11/05/17 0412  NA 142 141  K 3.8 4.0  CL 110 110  CO2 24 22  GLUCOSE 171* 116*  BUN 10 16  CREATININE 0.89 0.88  CALCIUM 9.1 8.7*   Liver Function Tests: No results for input(s): AST, ALT, ALKPHOS, BILITOT, PROT, ALBUMIN in the last 168 hours. Recent Labs  Lab 11/04/17 1624  LIPASE 28   No results for input(s): AMMONIA in the last 168 hours. CBC: Recent Labs  Lab 11/04/17 1624 11/05/17 0412 11/06/17 0509  WBC 3.9* 4.1 3.1*  NEUTROABS 2.4  --   --   HGB 12.9 11.5* 11.4*  HCT 39.7 35.1* 35.6*  MCV 93.0 92.6 94.4  PLT 227 198 194   Cardiac Enzymes: No results for input(s): CKTOTAL, CKMB, CKMBINDEX, TROPONINI in the last 168 hours. BNP: Invalid input(s): POCBNP CBG: No results for input(s): GLUCAP in the last 168 hours. D-Dimer No results for input(s): DDIMER in the last 72 hours. Hgb A1c No results for input(s): HGBA1C in the last 72 hours. Lipid Profile No results for input(s): CHOL, HDL, LDLCALC, TRIG, CHOLHDL, LDLDIRECT in the last 72 hours. Thyroid function studies No results for input(s): TSH, T4TOTAL, T3FREE, THYROIDAB in the last 72 hours.  Invalid input(s): FREET3 Anemia work up No results for input(s): VITAMINB12, FOLATE, FERRITIN, TIBC, IRON, RETICCTPCT in the last 72 hours. Urinalysis    Component Value Date/Time   COLORURINE YELLOW 11/04/2017 1826   APPEARANCEUR CLEAR 11/04/2017 1826   LABSPEC 1.026 11/04/2017 1826   PHURINE 5.0 11/04/2017 1826   GLUCOSEU NEGATIVE 11/04/2017 1826   HGBUR NEGATIVE 11/04/2017 1826   BILIRUBINUR  NEGATIVE 11/04/2017 1826   KETONESUR NEGATIVE 11/04/2017 1826   PROTEINUR NEGATIVE 11/04/2017 1826   UROBILINOGEN 0.2 04/01/2013 1812   NITRITE NEGATIVE 11/04/2017 1826   LEUKOCYTESUR NEGATIVE 11/04/2017 1826   Sepsis Labs Invalid input(s): PROCALCITONIN,  WBC,  LACTICIDVEN Microbiology No results found for this or any previous visit (from the past 240 hour(s)).   Time coordinating discharge: Over 30 minutes  SIGNED:   Rodena Goldmann, DO [facility] 11/06/2017, 9:43 AM Pager [phone]  If 7PM-7AM, please contact night-coverage [email or web address] Password [email or web address]

## 2017-11-06 NOTE — Progress Notes (Addendum)
          Daily Rounding Note  11/06/2017, 11:53 AM  LOS: 0 days   SUBJECTIVE:   Chief complaint: Hematochezia.  No recurrent BMs since passed black and brown stool yesterday.     OBJECTIVE:         Vital signs in last 24 hours:    Temp:  [98 F (36.7 C)-98.1 F (36.7 C)] 98.1 F (36.7 C) (03/07 0444) Pulse Rate:  [75-84] 75 (03/07 0444) Resp:  [16-18] 16 (03/07 0444) BP: (106-115)/(55-66) 106/66 (03/07 0444) SpO2:  [97 %-100 %] 100 % (03/07 0444) Last BM Date: 11/04/17 Filed Weights   11/04/17 1611 11/04/17 2010  Weight: 212 lb (96.2 kg) 209 lb 14.1 oz (95.2 kg)   General: pleasant, looks well   Heart: RRR Chest: clear bil.  No SOB Abdomen: soft, NT, active BS  Extremities: no CCE Neuro/Psych:  Oriented x 3.  No deficits.    Intake/Output from previous day: 03/06 0701 - 03/07 0700 In: 460 [P.O.:460] Out: -   Intake/Output this shift: No intake/output data recorded.  Lab Results: Recent Labs    11/04/17 1624 11/05/17 0412 11/06/17 0509  WBC 3.9* 4.1 3.1*  HGB 12.9 11.5* 11.4*  HCT 39.7 35.1* 35.6*  PLT 227 198 194   BMET Recent Labs    11/04/17 1624 11/05/17 0412  NA 142 141  K 3.8 4.0  CL 110 110  CO2 24 22  GLUCOSE 171* 116*  BUN 10 16  CREATININE 0.89 0.88  CALCIUM 9.1 8.7*    ASSESMENT:   *  LGIB, presumed diverticular, first ever incidence.  Resolved.   Hgb stable following initial drop, did not require transfusion.      PLAN   *  Ok to discharge home.  No need for GI fup.  Advised pt to proceed directly to ED if recurrent hematochezia.      Azucena Freed  11/06/2017, 11:53 AM Pager: [phone]   Attending physician's note   I have taken an interval history, reviewed the chart and examined the patient. I agree with the Advanced Practitioner's note, impression and recommendations.   Damaris Hippo, MD [phone] Mon-Fri 8a-5p [phone] after 5p, weekends, holidays

## 2017-11-06 NOTE — Care Management Obs Status (Signed)
[place] NOTIFICATION   Patient Details  Name: Angela [name] MRN: [id number] Date of Birth: 25-Jan-1941   Medicare Observation Status Notification Given:  Yes    Sharin Mons, RN 11/06/2017, 12:12 PM

## 2017-11-07 ENCOUNTER — Telehealth: Payer: Self-pay | Admitting: Physician Assistant

## 2017-11-07 NOTE — Telephone Encounter (Signed)
eroneous

## 2018-01-15 DIAGNOSIS — Z79899 Other long term (current) drug therapy: Secondary | ICD-10-CM | POA: Diagnosis not present

## 2018-02-10 DIAGNOSIS — H2512 Age-related nuclear cataract, left eye: Secondary | ICD-10-CM | POA: Diagnosis not present

## 2018-02-10 DIAGNOSIS — H02403 Unspecified ptosis of bilateral eyelids: Secondary | ICD-10-CM | POA: Diagnosis not present

## 2018-02-10 DIAGNOSIS — Q150 Congenital glaucoma: Secondary | ICD-10-CM | POA: Diagnosis not present

## 2018-02-10 DIAGNOSIS — H5211 Myopia, right eye: Secondary | ICD-10-CM | POA: Diagnosis not present

## 2018-03-09 DIAGNOSIS — Z6835 Body mass index (BMI) 35.0-35.9, adult: Secondary | ICD-10-CM | POA: Diagnosis not present

## 2018-03-09 DIAGNOSIS — E663 Overweight: Secondary | ICD-10-CM | POA: Diagnosis not present

## 2018-03-09 DIAGNOSIS — Z86010 Personal history of colonic polyps: Secondary | ICD-10-CM | POA: Diagnosis not present

## 2018-03-09 DIAGNOSIS — H409 Unspecified glaucoma: Secondary | ICD-10-CM | POA: Diagnosis not present

## 2018-03-09 DIAGNOSIS — E7800 Pure hypercholesterolemia, unspecified: Secondary | ICD-10-CM | POA: Diagnosis not present

## 2018-03-09 DIAGNOSIS — K922 Gastrointestinal hemorrhage, unspecified: Secondary | ICD-10-CM | POA: Diagnosis not present

## 2018-04-20 DIAGNOSIS — Z1231 Encounter for screening mammogram for malignant neoplasm of breast: Secondary | ICD-10-CM | POA: Diagnosis not present

## 2018-04-20 DIAGNOSIS — Z853 Personal history of malignant neoplasm of breast: Secondary | ICD-10-CM | POA: Diagnosis not present

## 2018-05-15 DIAGNOSIS — Z23 Encounter for immunization: Secondary | ICD-10-CM | POA: Diagnosis not present

## 2018-06-22 DIAGNOSIS — M8589 Other specified disorders of bone density and structure, multiple sites: Secondary | ICD-10-CM | POA: Diagnosis not present

## 2018-06-22 DIAGNOSIS — Z780 Asymptomatic menopausal state: Secondary | ICD-10-CM | POA: Diagnosis not present

## 2018-06-23 DIAGNOSIS — Q150 Congenital glaucoma: Secondary | ICD-10-CM | POA: Diagnosis not present

## 2018-07-29 DIAGNOSIS — Q150 Congenital glaucoma: Secondary | ICD-10-CM | POA: Diagnosis not present

## 2018-10-06 DIAGNOSIS — H409 Unspecified glaucoma: Secondary | ICD-10-CM | POA: Diagnosis not present

## 2018-10-06 DIAGNOSIS — Z1389 Encounter for screening for other disorder: Secondary | ICD-10-CM | POA: Diagnosis not present

## 2018-10-06 DIAGNOSIS — Z0000 Encounter for general adult medical examination without abnormal findings: Secondary | ICD-10-CM | POA: Diagnosis not present

## 2018-10-06 DIAGNOSIS — E7800 Pure hypercholesterolemia, unspecified: Secondary | ICD-10-CM | POA: Diagnosis not present

## 2018-10-06 DIAGNOSIS — Z23 Encounter for immunization: Secondary | ICD-10-CM | POA: Diagnosis not present

## 2018-10-06 DIAGNOSIS — E663 Overweight: Secondary | ICD-10-CM | POA: Diagnosis not present

## 2018-10-21 DIAGNOSIS — S76012A Strain of muscle, fascia and tendon of left hip, initial encounter: Secondary | ICD-10-CM | POA: Diagnosis not present

## 2018-12-01 DIAGNOSIS — Q150 Congenital glaucoma: Secondary | ICD-10-CM | POA: Diagnosis not present

## 2019-03-02 DIAGNOSIS — Q150 Congenital glaucoma: Secondary | ICD-10-CM | POA: Diagnosis not present

## 2019-04-26 DIAGNOSIS — Z1231 Encounter for screening mammogram for malignant neoplasm of breast: Secondary | ICD-10-CM | POA: Diagnosis not present

## 2019-04-26 DIAGNOSIS — Z853 Personal history of malignant neoplasm of breast: Secondary | ICD-10-CM | POA: Diagnosis not present

## 2019-06-04 DIAGNOSIS — Q150 Congenital glaucoma: Secondary | ICD-10-CM | POA: Diagnosis not present

## 2019-08-20 DIAGNOSIS — Z20828 Contact with and (suspected) exposure to other viral communicable diseases: Secondary | ICD-10-CM | POA: Diagnosis not present

## 2019-08-20 DIAGNOSIS — Z6836 Body mass index (BMI) 36.0-36.9, adult: Secondary | ICD-10-CM | POA: Diagnosis not present

## 2019-09-11 DIAGNOSIS — M19071 Primary osteoarthritis, right ankle and foot: Secondary | ICD-10-CM | POA: Diagnosis not present

## 2019-09-13 DIAGNOSIS — E785 Hyperlipidemia, unspecified: Secondary | ICD-10-CM | POA: Diagnosis not present

## 2019-09-13 DIAGNOSIS — Z8249 Family history of ischemic heart disease and other diseases of the circulatory system: Secondary | ICD-10-CM | POA: Diagnosis not present

## 2020-01-15 DIAGNOSIS — Z8249 Family history of ischemic heart disease and other diseases of the circulatory system: Secondary | ICD-10-CM | POA: Diagnosis not present

## 2020-01-15 DIAGNOSIS — Z6836 Body mass index (BMI) 36.0-36.9, adult: Secondary | ICD-10-CM | POA: Diagnosis not present

## 2020-01-15 DIAGNOSIS — Z833 Family history of diabetes mellitus: Secondary | ICD-10-CM | POA: Diagnosis not present

## 2020-01-15 DIAGNOSIS — Z950 Presence of cardiac pacemaker: Secondary | ICD-10-CM | POA: Diagnosis not present

## 2020-01-15 DIAGNOSIS — H409 Unspecified glaucoma: Secondary | ICD-10-CM | POA: Diagnosis not present

## 2020-01-15 DIAGNOSIS — E785 Hyperlipidemia, unspecified: Secondary | ICD-10-CM | POA: Diagnosis not present

## 2020-01-15 DIAGNOSIS — R32 Unspecified urinary incontinence: Secondary | ICD-10-CM | POA: Diagnosis not present

## 2020-01-15 DIAGNOSIS — Z853 Personal history of malignant neoplasm of breast: Secondary | ICD-10-CM | POA: Diagnosis not present

## 2020-01-15 DIAGNOSIS — Z809 Family history of malignant neoplasm, unspecified: Secondary | ICD-10-CM | POA: Diagnosis not present

## 2020-02-14 DIAGNOSIS — Q150 Congenital glaucoma: Secondary | ICD-10-CM | POA: Diagnosis not present

## 2020-06-02 DIAGNOSIS — Q150 Congenital glaucoma: Secondary | ICD-10-CM | POA: Diagnosis not present

## 2020-09-12 DIAGNOSIS — H00035 Abscess of left lower eyelid: Secondary | ICD-10-CM | POA: Diagnosis not present

## 2020-09-12 DIAGNOSIS — Q150 Congenital glaucoma: Secondary | ICD-10-CM | POA: Diagnosis not present

## 2020-09-12 DIAGNOSIS — H02825 Cysts of left lower eyelid: Secondary | ICD-10-CM | POA: Diagnosis not present

## 2020-09-27 DIAGNOSIS — H02831 Dermatochalasis of right upper eyelid: Secondary | ICD-10-CM | POA: Diagnosis not present

## 2020-09-27 DIAGNOSIS — H53483 Generalized contraction of visual field, bilateral: Secondary | ICD-10-CM | POA: Diagnosis not present

## 2020-09-27 DIAGNOSIS — H02834 Dermatochalasis of left upper eyelid: Secondary | ICD-10-CM | POA: Diagnosis not present

## 2020-09-27 DIAGNOSIS — H0279 Other degenerative disorders of eyelid and periocular area: Secondary | ICD-10-CM | POA: Diagnosis not present

## 2020-09-27 DIAGNOSIS — H02423 Myogenic ptosis of bilateral eyelids: Secondary | ICD-10-CM | POA: Diagnosis not present

## 2020-09-28 DIAGNOSIS — H409 Unspecified glaucoma: Secondary | ICD-10-CM | POA: Diagnosis not present

## 2020-09-28 DIAGNOSIS — Z0000 Encounter for general adult medical examination without abnormal findings: Secondary | ICD-10-CM | POA: Diagnosis not present

## 2020-09-28 DIAGNOSIS — E785 Hyperlipidemia, unspecified: Secondary | ICD-10-CM | POA: Diagnosis not present

## 2020-09-28 DIAGNOSIS — M8580 Other specified disorders of bone density and structure, unspecified site: Secondary | ICD-10-CM | POA: Diagnosis not present

## 2020-09-28 DIAGNOSIS — R600 Localized edema: Secondary | ICD-10-CM | POA: Diagnosis not present

## 2020-09-28 DIAGNOSIS — Z79899 Other long term (current) drug therapy: Secondary | ICD-10-CM | POA: Diagnosis not present

## 2020-09-28 DIAGNOSIS — Z008 Encounter for other general examination: Secondary | ICD-10-CM | POA: Diagnosis not present

## 2020-09-28 DIAGNOSIS — H02409 Unspecified ptosis of unspecified eyelid: Secondary | ICD-10-CM | POA: Diagnosis not present

## 2020-09-28 DIAGNOSIS — H543 Unqualified visual loss, both eyes: Secondary | ICD-10-CM | POA: Diagnosis not present

## 2020-09-28 DIAGNOSIS — Z7189 Other specified counseling: Secondary | ICD-10-CM | POA: Diagnosis not present

## 2020-09-28 DIAGNOSIS — Z1159 Encounter for screening for other viral diseases: Secondary | ICD-10-CM | POA: Diagnosis not present

## 2020-10-11 DIAGNOSIS — H53481 Generalized contraction of visual field, right eye: Secondary | ICD-10-CM | POA: Diagnosis not present

## 2020-10-11 DIAGNOSIS — H53482 Generalized contraction of visual field, left eye: Secondary | ICD-10-CM | POA: Diagnosis not present

## 2020-10-11 DIAGNOSIS — H53483 Generalized contraction of visual field, bilateral: Secondary | ICD-10-CM | POA: Diagnosis not present

## 2020-12-11 DIAGNOSIS — Z1389 Encounter for screening for other disorder: Secondary | ICD-10-CM | POA: Diagnosis not present

## 2020-12-11 DIAGNOSIS — E782 Mixed hyperlipidemia: Secondary | ICD-10-CM | POA: Diagnosis not present

## 2020-12-11 DIAGNOSIS — G894 Chronic pain syndrome: Secondary | ICD-10-CM | POA: Diagnosis not present

## 2020-12-11 DIAGNOSIS — H579 Unspecified disorder of eye and adnexa: Secondary | ICD-10-CM | POA: Diagnosis not present

## 2020-12-11 DIAGNOSIS — R2689 Other abnormalities of gait and mobility: Secondary | ICD-10-CM | POA: Diagnosis not present

## 2020-12-11 DIAGNOSIS — Z859 Personal history of malignant neoplasm, unspecified: Secondary | ICD-10-CM | POA: Diagnosis not present

## 2020-12-13 DIAGNOSIS — H53481 Generalized contraction of visual field, right eye: Secondary | ICD-10-CM | POA: Diagnosis not present

## 2020-12-13 DIAGNOSIS — H53482 Generalized contraction of visual field, left eye: Secondary | ICD-10-CM | POA: Diagnosis not present

## 2020-12-13 DIAGNOSIS — H02423 Myogenic ptosis of bilateral eyelids: Secondary | ICD-10-CM | POA: Diagnosis not present

## 2020-12-13 DIAGNOSIS — H53483 Generalized contraction of visual field, bilateral: Secondary | ICD-10-CM | POA: Diagnosis not present

## 2020-12-21 DIAGNOSIS — Z79899 Other long term (current) drug therapy: Secondary | ICD-10-CM | POA: Diagnosis not present

## 2020-12-21 DIAGNOSIS — E782 Mixed hyperlipidemia: Secondary | ICD-10-CM | POA: Diagnosis not present

## 2020-12-29 DIAGNOSIS — R2689 Other abnormalities of gait and mobility: Secondary | ICD-10-CM | POA: Diagnosis not present

## 2020-12-29 DIAGNOSIS — E782 Mixed hyperlipidemia: Secondary | ICD-10-CM | POA: Diagnosis not present

## 2020-12-29 DIAGNOSIS — H579 Unspecified disorder of eye and adnexa: Secondary | ICD-10-CM | POA: Diagnosis not present

## 2021-03-12 ENCOUNTER — Other Ambulatory Visit (HOSPITAL_COMMUNITY): Payer: Self-pay | Admitting: Student

## 2021-03-12 DIAGNOSIS — I509 Heart failure, unspecified: Secondary | ICD-10-CM

## 2021-03-12 DIAGNOSIS — R7989 Other specified abnormal findings of blood chemistry: Secondary | ICD-10-CM

## 2021-03-30 ENCOUNTER — Other Ambulatory Visit: Payer: Self-pay

## 2021-03-30 ENCOUNTER — Ambulatory Visit (HOSPITAL_COMMUNITY): Payer: Medicare Other | Attending: Student

## 2021-03-30 DIAGNOSIS — I509 Heart failure, unspecified: Secondary | ICD-10-CM | POA: Diagnosis not present

## 2021-03-30 LAB — ECHOCARDIOGRAM COMPLETE
Area-P 1/2: 3.85 cm2
S' Lateral: 3.3 cm

## 2021-03-30 MED ORDER — PERFLUTREN LIPID MICROSPHERE
1.0000 mL | INTRAVENOUS | Status: AC | PRN
  Administered 2021-03-30: 2 mL via INTRAVENOUS

## 2021-04-04 ENCOUNTER — Other Ambulatory Visit: Payer: Self-pay | Admitting: Student

## 2021-04-04 DIAGNOSIS — R319 Hematuria, unspecified: Secondary | ICD-10-CM

## 2021-06-04 DIAGNOSIS — M85851 Other specified disorders of bone density and structure, right thigh: Secondary | ICD-10-CM | POA: Diagnosis not present

## 2021-06-04 DIAGNOSIS — Z853 Personal history of malignant neoplasm of breast: Secondary | ICD-10-CM | POA: Diagnosis not present

## 2021-06-04 DIAGNOSIS — Z1231 Encounter for screening mammogram for malignant neoplasm of breast: Secondary | ICD-10-CM | POA: Diagnosis not present

## 2021-06-04 DIAGNOSIS — M85852 Other specified disorders of bone density and structure, left thigh: Secondary | ICD-10-CM | POA: Diagnosis not present

## 2021-06-07 DIAGNOSIS — H401123 Primary open-angle glaucoma, left eye, severe stage: Secondary | ICD-10-CM | POA: Diagnosis not present

## 2021-06-07 DIAGNOSIS — H401113 Primary open-angle glaucoma, right eye, severe stage: Secondary | ICD-10-CM | POA: Diagnosis not present

## 2021-06-07 DIAGNOSIS — H53481 Generalized contraction of visual field, right eye: Secondary | ICD-10-CM | POA: Diagnosis not present

## 2021-06-07 DIAGNOSIS — H02421 Myogenic ptosis of right eyelid: Secondary | ICD-10-CM | POA: Diagnosis not present

## 2021-06-20 DIAGNOSIS — H53483 Generalized contraction of visual field, bilateral: Secondary | ICD-10-CM | POA: Diagnosis not present

## 2021-06-20 DIAGNOSIS — H53482 Generalized contraction of visual field, left eye: Secondary | ICD-10-CM | POA: Diagnosis not present

## 2021-06-20 DIAGNOSIS — H53481 Generalized contraction of visual field, right eye: Secondary | ICD-10-CM | POA: Diagnosis not present

## 2021-06-26 DIAGNOSIS — R921 Mammographic calcification found on diagnostic imaging of breast: Secondary | ICD-10-CM | POA: Diagnosis not present

## 2021-07-11 ENCOUNTER — Other Ambulatory Visit: Payer: Self-pay

## 2021-07-11 DIAGNOSIS — R921 Mammographic calcification found on diagnostic imaging of breast: Secondary | ICD-10-CM | POA: Diagnosis not present

## 2023-03-17 ENCOUNTER — Other Ambulatory Visit: Payer: Self-pay | Admitting: Student

## 2023-03-17 ENCOUNTER — Ambulatory Visit
Admission: RE | Admit: 2023-03-17 | Discharge: 2023-03-17 | Disposition: A | Discharge status: Home / Self Care | Payer: Medicare HMO | Source: Ambulatory Visit | Attending: Student | Admitting: Student

## 2023-03-17 DIAGNOSIS — M1712 Unilateral primary osteoarthritis, left knee: Secondary | ICD-10-CM

## 2023-03-17 DIAGNOSIS — M170 Bilateral primary osteoarthritis of knee: Secondary | ICD-10-CM

## 2023-04-07 ENCOUNTER — Encounter: Payer: Self-pay | Admitting: Physician Assistant

## 2023-04-07 ENCOUNTER — Telehealth: Payer: Self-pay | Admitting: Radiology

## 2023-04-07 ENCOUNTER — Ambulatory Visit (INDEPENDENT_AMBULATORY_CARE_PROVIDER_SITE_OTHER): Payer: Medicare HMO | Admitting: Physician Assistant

## 2023-04-07 DIAGNOSIS — M1711 Unilateral primary osteoarthritis, right knee: Secondary | ICD-10-CM | POA: Diagnosis not present

## 2023-04-07 MED ORDER — METHYLPREDNISOLONE ACETATE 40 MG/ML IJ SUSP
80.0000 mg | INTRAMUSCULAR | Status: AC | PRN
  Administered 2023-04-07: 80 mg via INTRA_ARTICULAR

## 2023-04-07 MED ORDER — BUPIVACAINE HCL 0.25 % IJ SOLN
2.0000 mL | INTRAMUSCULAR | Status: AC | PRN
  Administered 2023-04-07: 2 mL via INTRA_ARTICULAR

## 2023-04-07 MED ORDER — LIDOCAINE HCL 1 % IJ SOLN
2.0000 mL | INTRAMUSCULAR | Status: AC | PRN
  Administered 2023-04-07: 2 mL

## 2023-04-07 NOTE — Progress Notes (Signed)
Office Visit Note   Patient: Angela [name]           Date of Birth: 30-Nov-1940           MRN: [id number] Visit Date: 04/07/2023              Requested by: Raymon Mutton., FNP [address] [place],  [place] [address] PCP: Raymon Mutton., FNP   Assessment & Plan: Visit Diagnoses:  1. Unilateral primary osteoarthritis, right knee     Plan: Pleasant 82 year old woman with a long history of bilateral right and greater than left knee pain.  She denies any recent history.  She says in 2019 she was having trouble with her knees and was given an injection which really seem to help.  Denies any injury.  Findings consistent with osteoarthritis of both knees but more significantly in the right knee.  Recent x-rays demonstrate advanced tricompartmental arthritis with periarticular osteophytes and almost complete loss of medial joint space we discussed the natural history of this.  Will go forward with steroid injection today but she is interested in getting viscosupplementation we will place an order for this as well.  Also mentions to me that she has had intermittent pain in the plantar surface of her right foot in the fourth and fifth toe.  States she hit a heating pad a few years ago.  Had some bleeding.  Denies any other sequelae except for once a year she has pain in the plantar surface.  Cannot appreciate any kind of infective process or skin breakdown today.  Cannot palpate any foreign body.  Since this is periodic and happens just once a year we will try giving her a metatarsal pad to see if this helps.  She knows to take this out if it does not.  Could consider x-rays in the future This patient is diagnosed with osteoarthritis of the knee(s).    Radiographs show evidence of joint space narrowing, osteophytes, subchondral sclerosis and/or subchondral cysts.  This patient has knee pain which interferes with functional and activities of daily living.    This patient has experienced  inadequate response, adverse effects and/or intolerance with conservative treatments such as acetaminophen, NSAIDS, topical creams, physical therapy or regular exercise, knee bracing and/or weight loss.   This patient has experienced inadequate response or has a contraindication to intra articular steroid injections for at least 3 months.   This patient is not scheduled to have a total knee replacement within 6 months of starting treatment with viscosupplementation.   Follow-Up Instructions: No follow-ups on file.   Orders:  No orders of the defined types were placed in this encounter.  No orders of the defined types were placed in this encounter.     Procedures: Large Joint Inj: R knee on 04/07/2023 10:35 AM Indications: pain and diagnostic evaluation Details: 25 G 1.5 in needle, anteromedial approach  Arthrogram: No  Medications: 80 mg methylPREDNISolone acetate 40 MG/ML; 2 mL lidocaine 1 %; 2 mL bupivacaine 0.25 % Outcome: tolerated well, no immediate complications Procedure, treatment alternatives, risks and benefits explained, specific risks discussed. Consent was given by the patient.     Clinical Data: No additional findings.   Subjective: Chief Complaint  Patient presents with   Right Knee - Pain    HPI pleasant 82 year old woman with long history of bilateral arthritis in her knees.  Has been told she has bone-on-bone.  Had an injection a few years ago which seemed to help.  Now having recurrence of pain and wondering about viscosupplementation as well.  Describes her pain as moderate  Review of Systems  All other systems reviewed and are negative.    Objective: Vital Signs: There were no vitals taken for this visit.  Physical Exam Constitutional:      Appearance: Normal appearance.  Pulmonary:     Effort: Pulmonary effort is normal.  Skin:    General: Skin is warm and dry.  Neurological:     General: No focal deficit present.     Mental Status: She is  alert and oriented to person, place, and time.  Psychiatric:        Mood and Affect: Mood normal.        Behavior: Behavior normal.     Ortho Exam [place] of her right knee demonstrates no redness no erythema no effusion she does have positive grinding and some varus alignment.  Compartments are soft nontender she is neurovascular intact. Examination of her right foot demonstrates toes warm and pink with brisk capillary refill no evidence of any retained body by clinical exam.  No evidence of any cellulitis or infection.  Tender over the metatarsal heads Specialty Comments:  No specialty comments available.  Imaging: No results found.   PMFS History: Patient Active Problem List   Diagnosis Date Noted   Unilateral primary osteoarthritis, right knee 04/07/2023   BRBPR (bright red blood per rectum) 11/04/2017   Malignant neoplasm of lower-inner quadrant of right breast of female, estrogen receptor positive (HCC) 06/14/2013   Vestibular labyrinthitis 04/03/2013   Dizziness 04/01/2013   Cholelithiasis 11/16/2012   Choledocholithiasis with obstruction 11/16/2012   Jaundice 11/16/2012   Nausea and vomiting 11/14/2012   Abdominal pain, right upper quadrant 11/14/2012   Arthritis 12/20/2011   Glaucoma 12/20/2011   Hypercholesterolemia 12/20/2011   Past Medical History:  Diagnosis Date   Arthritis    Blindness    90%   Breast cancer (HCC) 03/19/2007   right   Choledocholithiasis with obstruction 11/16/2012   GERD (gastroesophageal reflux disease)    Glaucoma    Herpes simplex type II infection 1999   Hyperlipidemia    Irritable bowel syndrome (IBS)    Mitral valve prolapse    Myasthenia gravis    eyelids weakness.     Family History  Problem Relation Age of Onset   Cancer Mother    Hypertension Sister     Past Surgical History:  Procedure Laterality Date   AXILLARY NODE DISSECTION  05/06/2007   right   BREAST LUMPECTOMY  04/06/2007   right   CHOLECYSTECTOMY N/A 11/18/2012    Procedure: LAPAROSCOPIC CHOLECYSTECTOMY WITH INTRAOPERATIVE CHOLANGIOGRAM;  Surgeon: Lodema Pilot, DO;  Location: [facility] OR;  Service: General;  Laterality: N/A;   ERCP N/A 11/16/2012   Procedure: ENDOSCOPIC RETROGRADE CHOLANGIOPANCREATOGRAPHY (ERCP);  Surgeon: Iva Boop, MD;  Location[facility];  Service: Endoscopy;  Laterality: N/A;   ERCP  11/16/2012   EYE SURGERY  1993   KNEE SURGERY  2011   TONSILLECTOMY  82 YEARS OLD   TOTAL ABDOMINAL HYSTERECTOMY W/ BILATERAL SALPINGOOPHORECTOMY     VARICOSE VEIN SURGERY  72S   Social History   Occupational History   Not on file  Tobacco Use   Smoking status: Never   Smokeless tobacco: Never  Substance and Sexual Activity   Alcohol use: No   Drug use: No   Sexual activity: Not on file

## 2023-04-07 NOTE — Telephone Encounter (Signed)
Angela [name] wants to order visco supplementation  for this patients right knee.

## 2023-04-08 NOTE — Telephone Encounter (Signed)
VOB submitted for Durolane, right knee.

## 2023-04-09 ENCOUNTER — Telehealth: Payer: Self-pay

## 2023-04-09 NOTE — Telephone Encounter (Signed)
Started PA online through [id number] portal for Durolane, right knee. PA pending.

## 2023-07-10 ENCOUNTER — Other Ambulatory Visit: Payer: Self-pay | Admitting: Student

## 2023-07-10 DIAGNOSIS — B182 Chronic viral hepatitis C: Secondary | ICD-10-CM

## 2023-07-22 ENCOUNTER — Ambulatory Visit
Admission: RE | Admit: 2023-07-22 | Discharge: 2023-07-22 | Disposition: A | Discharge status: Home / Self Care | Payer: Medicare HMO | Source: Ambulatory Visit | Attending: Student

## 2023-07-22 DIAGNOSIS — B182 Chronic viral hepatitis C: Secondary | ICD-10-CM

## 2023-07-23 ENCOUNTER — Ambulatory Visit
Admission: RE | Admit: 2023-07-23 | Discharge: 2023-07-23 | Disposition: A | Discharge status: Home / Self Care | Payer: Medicare HMO | Source: Ambulatory Visit | Attending: Student | Admitting: Student

## 2023-07-23 ENCOUNTER — Other Ambulatory Visit: Payer: Self-pay | Admitting: Student

## 2023-07-23 DIAGNOSIS — J189 Pneumonia, unspecified organism: Secondary | ICD-10-CM

## 2023-11-25 DIAGNOSIS — I872 Venous insufficiency (chronic) (peripheral): Secondary | ICD-10-CM | POA: Diagnosis not present

## 2023-11-25 DIAGNOSIS — Z139 Encounter for screening, unspecified: Secondary | ICD-10-CM | POA: Diagnosis not present

## 2023-11-25 DIAGNOSIS — R3915 Urgency of urination: Secondary | ICD-10-CM | POA: Diagnosis not present

## 2023-11-25 DIAGNOSIS — R2689 Other abnormalities of gait and mobility: Secondary | ICD-10-CM | POA: Diagnosis not present

## 2023-11-25 DIAGNOSIS — I110 Hypertensive heart disease with heart failure: Secondary | ICD-10-CM | POA: Diagnosis not present

## 2023-11-25 DIAGNOSIS — Z136 Encounter for screening for cardiovascular disorders: Secondary | ICD-10-CM | POA: Diagnosis not present

## 2023-11-25 DIAGNOSIS — Z79899 Other long term (current) drug therapy: Secondary | ICD-10-CM | POA: Diagnosis not present

## 2023-11-26 ENCOUNTER — Other Ambulatory Visit: Payer: Self-pay | Admitting: Student

## 2023-11-26 DIAGNOSIS — K769 Liver disease, unspecified: Secondary | ICD-10-CM

## 2024-04-08 DIAGNOSIS — M7989 Other specified soft tissue disorders: Secondary | ICD-10-CM | POA: Diagnosis not present

## 2024-04-08 DIAGNOSIS — M545 Low back pain, unspecified: Secondary | ICD-10-CM | POA: Diagnosis not present

## 2024-04-08 DIAGNOSIS — Z139 Encounter for screening, unspecified: Secondary | ICD-10-CM | POA: Diagnosis not present

## 2024-04-09 ENCOUNTER — Other Ambulatory Visit: Payer: Self-pay | Admitting: Adult Health Nurse Practitioner

## 2024-04-09 ENCOUNTER — Ambulatory Visit
Admission: RE | Admit: 2024-04-09 | Discharge: 2024-04-09 | Disposition: A | Discharge status: Home / Self Care | Source: Ambulatory Visit | Attending: Adult Health Nurse Practitioner | Admitting: Adult Health Nurse Practitioner

## 2024-04-09 DIAGNOSIS — M1611 Unilateral primary osteoarthritis, right hip: Secondary | ICD-10-CM | POA: Diagnosis not present

## 2024-04-09 DIAGNOSIS — M51361 Other intervertebral disc degeneration, lumbar region with lower extremity pain only: Secondary | ICD-10-CM | POA: Diagnosis not present

## 2024-04-09 DIAGNOSIS — M25551 Pain in right hip: Secondary | ICD-10-CM

## 2024-04-09 DIAGNOSIS — M4317 Spondylolisthesis, lumbosacral region: Secondary | ICD-10-CM | POA: Diagnosis not present

## 2024-08-18 ENCOUNTER — Emergency Department (HOSPITAL_COMMUNITY)
Admission: EM | Admit: 2024-08-18 | Discharge: 2024-08-18 | Disposition: A | Discharge status: Home / Self Care | Payer: Medicare (Managed Care) | Attending: Emergency Medicine | Admitting: Emergency Medicine

## 2024-08-18 ENCOUNTER — Emergency Department (HOSPITAL_COMMUNITY): Payer: Medicare (Managed Care)

## 2024-08-18 ENCOUNTER — Encounter (HOSPITAL_COMMUNITY): Payer: Self-pay

## 2024-08-18 ENCOUNTER — Other Ambulatory Visit: Payer: Self-pay

## 2024-08-18 DIAGNOSIS — R55 Syncope and collapse: Secondary | ICD-10-CM | POA: Diagnosis not present

## 2024-08-18 DIAGNOSIS — S300XXA Contusion of lower back and pelvis, initial encounter: Secondary | ICD-10-CM | POA: Insufficient documentation

## 2024-08-18 DIAGNOSIS — W1839XA Other fall on same level, initial encounter: Secondary | ICD-10-CM | POA: Insufficient documentation

## 2024-08-18 DIAGNOSIS — M25552 Pain in left hip: Secondary | ICD-10-CM | POA: Insufficient documentation

## 2024-08-18 DIAGNOSIS — S3992XA Unspecified injury of lower back, initial encounter: Secondary | ICD-10-CM | POA: Diagnosis present

## 2024-08-18 DIAGNOSIS — W19XXXA Unspecified fall, initial encounter: Secondary | ICD-10-CM

## 2024-08-18 LAB — CBC
HCT: 42.6 % (ref 36.0–46.0)
Hemoglobin: 14.1 g/dL (ref 12.0–15.0)
MCH: 30.5 pg (ref 26.0–34.0)
MCHC: 33.1 g/dL (ref 30.0–36.0)
MCV: 92 fL (ref 80.0–100.0)
Platelets: 219 K/uL (ref 150–400)
RBC: 4.63 MIL/uL (ref 3.87–5.11)
RDW: 15.2 % (ref 11.5–15.5)
WBC: 4.8 K/uL (ref 4.0–10.5)
nRBC: 0 % (ref 0.0–0.2)

## 2024-08-18 LAB — URINALYSIS, ROUTINE W REFLEX MICROSCOPIC
Bilirubin Urine: NEGATIVE
Glucose, UA: NEGATIVE mg/dL
Ketones, ur: NEGATIVE mg/dL
Nitrite: NEGATIVE
Protein, ur: NEGATIVE mg/dL
Specific Gravity, Urine: 1.01 (ref 1.005–1.030)
pH: 6 (ref 5.0–8.0)

## 2024-08-18 LAB — COMPREHENSIVE METABOLIC PANEL WITH GFR
ALT: 21 U/L (ref 0–44)
AST: 19 U/L (ref 15–41)
Albumin: 3.6 g/dL (ref 3.5–5.0)
Alkaline Phosphatase: 69 U/L (ref 38–126)
Anion gap: 11 (ref 5–15)
BUN: 13 mg/dL (ref 8–23)
CO2: 24 mmol/L (ref 22–32)
Calcium: 9.8 mg/dL (ref 8.9–10.3)
Chloride: 103 mmol/L (ref 98–111)
Creatinine, Ser: 0.67 mg/dL (ref 0.44–1.00)
GFR, Estimated: >60 mL/min (ref >60)
Glucose, Bld: 114 mg/dL — ABNORMAL HIGH (ref 70–99)
Potassium: 3.9 mmol/L (ref 3.5–5.1)
Sodium: 138 mmol/L (ref 135–145)
Total Bilirubin: 0.5 mg/dL (ref 0.0–1.2)
Total Protein: 6.4 g/dL — ABNORMAL LOW (ref 6.5–8.1)

## 2024-08-18 LAB — TROPONIN T, HIGH SENSITIVITY: Troponin T High Sensitivity: <15 ng/L (ref 0–19)

## 2024-08-18 LAB — CBG MONITORING, ED: Glucose-Capillary: 124 mg/dL — ABNORMAL HIGH (ref 70–99)

## 2024-08-18 MED ORDER — ACETAMINOPHEN 325 MG PO TABS
975.0000 mg | ORAL_TABLET | Freq: Once | ORAL | Status: AC
  Administered 2024-08-18: 22:00:00 975 mg via ORAL
  Filled 2024-08-18: qty 3

## 2024-08-18 NOTE — ED Provider Notes (Signed)
 [place] EMERGENCY DEPARTMENT AT [facility] Provider Note   CSN: [id number] Arrival date & time: 08/18/24  1526     Patient presents with: Loss of Consciousness and Fall   Angela [name] is a 83 y.o. female.  With a history of a vestibular labyrinthitis and glaucoma who presents to the ED for injuries after fall.  Patient was shopping with her son today when she fell backwards landing on her bottom.  Denies LOC or head injury.  Now having pain in the left buttock left hip.  No anticoagulation.  No other injuries reported.    Loss of Consciousness Fall       Prior to Admission medications  Medication Sig Start Date End Date Taking? Authorizing Provider  cholecalciferol  (VITAMIN D ) 1000 UNITS tablet Take 1 tablet (1,000 Units total) by mouth daily. Patient taking differently: Take 1,000 Units by mouth every other day.  06/25/15   Magrinat, Sandria BROCKS, MD  diphenhydrAMINE  (BENADRYL ) 25 MG tablet Take 25 mg by mouth every morning.     [provider]  Loteprednol  Etabonate (LOTEMAX ) 0.5 % GEL Place 1 drop into the right eye 2 (two) times daily.    [provider]  meclizine  (ANTIVERT ) 12.5 MG tablet Take 1 tablet (12.5 mg total) by mouth 3 (three) times daily as needed for dizziness. 06/16/16   Griselda Norris, MD  Multiple Vitamin (MULTIVITAMIN WITH MINERALS) TABS Take 1 tablet by mouth daily.     [provider]  naproxen sodium (ALEVE) 220 MG tablet Take 220-440 mg by mouth 2 (two) times daily as needed (for pain).     [provider]  omega-3 acid ethyl esters (LOVAZA ) 1 g capsule Take 1 g by mouth every other day.    [provider]  timolol  (TIMOPTIC -XR) 0.5 % ophthalmic gel-forming Place 1 drop into the right eye at bedtime.    [provider]  vitamin C (ASCORBIC ACID) 500 MG tablet Take 500 mg by mouth daily.    [provider]    Allergies: Pravachol    Review of Systems  Cardiovascular:   Positive for syncope.    Updated Vital Signs BP 129/78 (BP Location: Left Arm)   Pulse 86   Temp 99 F (37.2 C) (Oral)   Resp 14   Ht 5' 7 (1.702 m)   Wt 108.4 kg   SpO2 99%   BMI 37.43 kg/m   Physical Exam Vitals and nursing note reviewed.  HENT:     Head: Normocephalic and atraumatic.  Eyes:     Pupils: Pupils are equal, round, and reactive to light.  Cardiovascular:     Rate and Rhythm: Normal rate and regular rhythm.  Pulmonary:     Effort: Pulmonary effort is normal.     Breath sounds: Normal breath sounds.  Abdominal:     Palpations: Abdomen is soft.     Tenderness: There is no abdominal tenderness.  Musculoskeletal:     Cervical back: Neck supple. No tenderness.     Comments: Tenderness over left anterior hip and left buttock No midline tender step-off deformity back Full active range of motion with sensation intact to touch throughout all 4 extremities  Skin:    General: Skin is warm and dry.  Neurological:     Mental Status: She is alert and oriented to person, place, and time.     Sensory: No sensory deficit.     Motor: No weakness.  Psychiatric:  Mood and Affect: Mood normal.     (all labs ordered are listed, but only abnormal results are displayed) Labs Reviewed  COMPREHENSIVE METABOLIC PANEL WITH GFR - Abnormal; Notable for the following components:      Result Value   Glucose, Bld 114 (*)    Total Protein 6.4 (*)    All other components within normal limits  URINALYSIS, ROUTINE W REFLEX MICROSCOPIC - Abnormal; Notable for the following components:   APPearance HAZY (*)    Hgb urine dipstick SMALL (*)    Leukocytes,Ua TRACE (*)    Bacteria, UA RARE (*)    All other components within normal limits  CBG MONITORING, ED - Abnormal; Notable for the following components:   Glucose-Capillary 124 (*)    All other components within normal limits  CBC  TROPONIN T, HIGH SENSITIVITY    EKG: EKG Interpretation Date/Time:  Wednesday August 18 2024 16:56:04 EST Ventricular Rate:  104 PR Interval:  150 QRS Duration:  68 QT Interval:  304 QTC Calculation: 399 R Axis:   -72  Text Interpretation: Sinus tachycardia Left axis deviation Anterolateral infarct , age undetermined Abnormal ECG When compared with ECG of 16-Jun-2016 10:01, PREVIOUS ECG IS PRESENT Confirmed by Pamella Sharper [id number]) on 08/18/2024 10:23:29 PM  Radiology: [facility] Pelvis 1-2 Views Result Date: 08/18/2024 EXAM: 1 or 2 VIEW(S) XRAY OF THE PELVIS 08/18/2024 09:32:00 PM COMPARISON: None available. CLINICAL HISTORY: L hip pain s/p fall FINDINGS: BONES AND JOINTS: No acute fracture. No malalignment. Degenerative changes of visualized lower lumbar spine. Mild degenerative changes of bilateral hips. SOFT TISSUES: The soft tissues are unremarkable. IMPRESSION: 1. No acute findings. 2. Mild degenerative changes of the bilateral hips. 3. Degenerative changes of the visualized lower lumbar spine. Electronically signed by: Greig Pique MD 08/18/2024 09:37 PM EST RP Workstation: [id number]   CT Cervical Spine Wo Contrast Result Date: 08/18/2024 EXAM: CT CERVICAL SPINE WITHOUT CONTRAST 08/18/2024 05:57:18 PM TECHNIQUE: CT of the cervical spine was performed without the administration of intravenous contrast. Multiplanar reformatted images are provided for review. Automated exposure control, iterative reconstruction, and/or weight based adjustment of the mA/kV was utilized to reduce the radiation dose to as low as reasonably achievable. COMPARISON: None available. CLINICAL HISTORY: fall fall FINDINGS: BONES AND ALIGNMENT: No acute fracture or traumatic malalignment. DEGENERATIVE CHANGES: Moderate-to-severe bilateral neural foraminal narrowing at C5-C6 and C6-C7, left greater than right. Diffuse disc space narrowing and endplate remodeling throughout the cervical spine in keeping with changes of diffuse severe degenerative disc disease. No high-grade canal stenosis. SOFT TISSUES: No  prevertebral soft tissue swelling. IMPRESSION: 1. No acute cervical spine fracture or traumatic malalignment. 2. Diffuse severe degenerative disc disease with moderate-to-severe bilateral neural foraminal narrowing at C5-6 and C6-7, left greater than right, without high-grade canal stenosis. Electronically signed by: Dorethia Molt MD 08/18/2024 06:05 PM EST RP Workstation: [id number]   CT Head Wo Contrast Result Date: 08/18/2024 EXAM: CT HEAD WITHOUT CONTRAST 08/18/2024 05:57:18 PM TECHNIQUE: CT of the head was performed without the administration of intravenous contrast. Automated exposure control, iterative reconstruction, and/or weight based adjustment of the mA/kV was utilized to reduce the radiation dose to as low as reasonably achievable. COMPARISON: None available. CLINICAL HISTORY: Syncope, fall FINDINGS: BRAIN AND VENTRICLES: No acute hemorrhage. No evidence of acute infarct. No hydrocephalus. No extra-axial collection. No mass effect or midline shift. Generalized cerebral atrophy. Ill-defined white matter hypoattenuation consistent with chronic small vessel ischemic disease. Intracranial arterial calcification. ORBITS: No acute abnormality. Right lens replacement. SINUSES:  No acute abnormality. SOFT TISSUES AND SKULL: No acute soft tissue abnormality. No skull fracture. IMPRESSION: 1. No acute intracranial abnormality. 2. Generalized cerebral atrophy and chronic small vessel ischemic disease. 3. Intracranial arterial calcification. 4. Right lens replacement. Electronically signed by: Dorethia Molt MD 08/18/2024 06:03 PM EST RP Workstation: [id number]     Procedures   Medications Ordered in the ED  acetaminophen  (TYLENOL ) tablet 975 mg (975 mg Oral Given 08/18/24 2212)                                    Medical Decision Making 83 year old female presenting after reported syncopal episode with fall while shopping.  Is unclear on whether or not she lost consciousness but does not think she  did after the fall is not certain when she fell backward.  No ischemic changes or dysrhythmia on EKG.  Has stated troponin is negative.  No other significant laboratory abnormalities that would point to an underlying cause of syncope.  No traumatic findings on CT head C-spine and pelvis x-ray looks okay.  She is able to ambulate and feels well here.  No indication for admission at this time but she will follow-up with her PCP  Amount and/or Complexity of Data Reviewed Labs: ordered. Radiology: ordered.  Risk OTC drugs.        Final diagnoses:  Fall, initial encounter  Syncope and collapse  Contusion of buttock, initial encounter    ED Discharge Orders     None          Pamella Ozell LABOR, DO 08/18/24 2230

## 2024-08-18 NOTE — ED Triage Notes (Signed)
 BEFAST exam during triage is negative with no focal neuro findings. Pt c/o pain to her lower back and bilateral knees from her fall.

## 2024-08-18 NOTE — ED Triage Notes (Signed)
 Pt states she was walking in a store with her son when she suddenly started staggering backwards and her vision went black and she fell. Pt denies LOC ? Pt denies any injuries.

## 2024-08-18 NOTE — ED Notes (Signed)
 Patient transported to X-ray

## 2024-08-18 NOTE — Discharge Instructions (Signed)
 You were seen in the Emergency Department after a fall There was no evidence of heart attack or other abnormalities on your labs The CAT scan of your head and neck did not show any acute injury The x-ray of your hip looked okay You felt well and were stable during your time in the emergency department Need to follow-up with your primary care doctor Take Tylenol  as directed for pain Return to the emerged department for repeated falls, repeated episodes of fainting or any other concerns

## 2024-08-18 NOTE — ED Provider Triage Note (Signed)
 Emergency Medicine Provider Triage Evaluation Note  Angela [name] , a 83 y.o. female  was evaluated in triage.  Pt complains of syncopal episode that occurred around 2 PM this afternoon without any prodromal symptoms.  Reportedly was without any seizure-like activity.  Was not postictal.  Denies any significant cardiac history, history of PE or seizures.  Feels at baseline at this time.   Review of Systems  Positive:  Negative:   Physical Exam  BP 134/86 (BP Location: Left Arm)   Pulse (!) 110   Temp 98.3 F (36.8 C)   Resp 18   Ht 5' 7 (1.702 m)   Wt 108.4 kg   SpO2 93%   BMI 37.43 kg/m  Gen:   Awake, no distress   Resp:  Normal effort  MSK:   Moves extremities without difficulty  Other:  No focal deficit  Medical Decision Making  Medically screening exam initiated at 5:28 PM.  Appropriate orders placed.  Angela [name] was informed that the remainder of the evaluation will be completed by another provider, this initial triage assessment does not replace that evaluation, and the importance of remaining in the ED until their evaluation is complete.  Does have a history of breast cancer and is tachycardic upon arrival however this is remote. No chest pain or shortness of breath.    Will hold off on PE workup at this time.   Angela Lynwood DEL, PA-C 08/18/24 1730
